# Patient Record
Sex: Female | Born: 1970 | Race: Black or African American | Hispanic: No | Marital: Single | Smoking: Never smoker
Health system: Southern US, Community
[De-identification: ages and names within clinical notes are randomized; demographics above are authoritative.]

## PROBLEM LIST (undated history)

## (undated) DIAGNOSIS — G4730 Sleep apnea, unspecified: Secondary | ICD-10-CM

## (undated) DIAGNOSIS — I10 Essential (primary) hypertension: Secondary | ICD-10-CM

---

## 1998-07-21 ENCOUNTER — Encounter: Admission: RE | Admit: 1998-07-21 | Discharge: 1998-07-21 | Payer: Self-pay | Admitting: Family Medicine

## 1998-08-21 ENCOUNTER — Encounter: Admission: RE | Admit: 1998-08-21 | Discharge: 1998-08-21 | Payer: Self-pay | Admitting: Family Medicine

## 1998-08-25 ENCOUNTER — Encounter: Admission: RE | Admit: 1998-08-25 | Discharge: 1998-08-25 | Payer: Self-pay | Admitting: Family Medicine

## 1999-07-31 ENCOUNTER — Encounter: Admission: RE | Admit: 1999-07-31 | Discharge: 1999-07-31 | Payer: Self-pay | Admitting: Family Medicine

## 1999-08-30 ENCOUNTER — Encounter: Payer: Self-pay | Admitting: *Deleted

## 1999-08-30 ENCOUNTER — Emergency Department (HOSPITAL_COMMUNITY): Admission: EM | Admit: 1999-08-30 | Discharge: 1999-08-30 | Payer: Self-pay | Admitting: *Deleted

## 1999-11-20 ENCOUNTER — Ambulatory Visit (HOSPITAL_COMMUNITY): Admission: RE | Admit: 1999-11-20 | Discharge: 1999-11-20 | Payer: Self-pay | Admitting: *Deleted

## 1999-11-20 ENCOUNTER — Encounter: Admission: RE | Admit: 1999-11-20 | Discharge: 1999-11-20 | Payer: Self-pay | Admitting: Family Medicine

## 1999-12-08 ENCOUNTER — Encounter: Admission: RE | Admit: 1999-12-08 | Discharge: 1999-12-08 | Payer: Self-pay | Admitting: Family Medicine

## 2000-03-22 ENCOUNTER — Encounter: Admission: RE | Admit: 2000-03-22 | Discharge: 2000-03-22 | Payer: Self-pay | Admitting: Sports Medicine

## 2000-04-28 ENCOUNTER — Emergency Department (HOSPITAL_COMMUNITY): Admission: EM | Admit: 2000-04-28 | Discharge: 2000-04-28 | Payer: Self-pay | Admitting: *Deleted

## 2000-10-17 ENCOUNTER — Encounter: Payer: Self-pay | Admitting: Family Medicine

## 2000-10-17 ENCOUNTER — Inpatient Hospital Stay (HOSPITAL_COMMUNITY): Admission: AD | Admit: 2000-10-17 | Discharge: 2000-10-17 | Payer: Self-pay | Admitting: Obstetrics

## 2000-10-17 ENCOUNTER — Encounter: Admission: RE | Admit: 2000-10-17 | Discharge: 2000-10-17 | Payer: Self-pay | Admitting: Family Medicine

## 2001-01-19 ENCOUNTER — Encounter: Admission: RE | Admit: 2001-01-19 | Discharge: 2001-01-19 | Payer: Self-pay | Admitting: Family Medicine

## 2001-04-17 ENCOUNTER — Other Ambulatory Visit: Admission: RE | Admit: 2001-04-17 | Discharge: 2001-04-17 | Payer: Self-pay | Admitting: *Deleted

## 2001-04-17 ENCOUNTER — Encounter: Admission: RE | Admit: 2001-04-17 | Discharge: 2001-04-17 | Payer: Self-pay | Admitting: Family Medicine

## 2001-10-16 ENCOUNTER — Encounter: Admission: RE | Admit: 2001-10-16 | Discharge: 2001-10-16 | Payer: Self-pay | Admitting: Family Medicine

## 2001-10-16 ENCOUNTER — Ambulatory Visit (HOSPITAL_COMMUNITY): Admission: RE | Admit: 2001-10-16 | Discharge: 2001-10-16 | Payer: Self-pay | Admitting: Family Medicine

## 2003-12-13 ENCOUNTER — Encounter: Admission: RE | Admit: 2003-12-13 | Discharge: 2003-12-13 | Payer: Self-pay | Admitting: Family Medicine

## 2004-01-30 ENCOUNTER — Encounter: Admission: RE | Admit: 2004-01-30 | Discharge: 2004-01-30 | Payer: Self-pay | Admitting: Family Medicine

## 2004-03-23 ENCOUNTER — Encounter: Admission: RE | Admit: 2004-03-23 | Discharge: 2004-03-23 | Payer: Self-pay | Admitting: Family Medicine

## 2004-06-02 ENCOUNTER — Encounter: Admission: RE | Admit: 2004-06-02 | Discharge: 2004-06-02 | Payer: Self-pay | Admitting: Family Medicine

## 2004-06-03 ENCOUNTER — Encounter: Admission: RE | Admit: 2004-06-03 | Discharge: 2004-06-03 | Payer: Self-pay | Admitting: Family Medicine

## 2004-12-28 ENCOUNTER — Ambulatory Visit: Payer: Self-pay | Admitting: Family Medicine

## 2004-12-30 ENCOUNTER — Encounter (INDEPENDENT_AMBULATORY_CARE_PROVIDER_SITE_OTHER): Payer: Self-pay | Admitting: *Deleted

## 2004-12-30 LAB — CONVERTED CEMR LAB

## 2005-01-29 ENCOUNTER — Ambulatory Visit: Payer: Self-pay | Admitting: Family Medicine

## 2005-03-23 ENCOUNTER — Ambulatory Visit: Payer: Self-pay | Admitting: Family Medicine

## 2006-12-29 DIAGNOSIS — K219 Gastro-esophageal reflux disease without esophagitis: Secondary | ICD-10-CM | POA: Insufficient documentation

## 2006-12-30 ENCOUNTER — Encounter (INDEPENDENT_AMBULATORY_CARE_PROVIDER_SITE_OTHER): Payer: Self-pay | Admitting: *Deleted

## 2007-04-05 ENCOUNTER — Telehealth: Payer: Self-pay | Admitting: *Deleted

## 2007-04-05 ENCOUNTER — Ambulatory Visit: Payer: Self-pay | Admitting: Family Medicine

## 2007-05-04 ENCOUNTER — Telehealth: Payer: Self-pay | Admitting: *Deleted

## 2007-12-07 ENCOUNTER — Ambulatory Visit: Payer: Self-pay | Admitting: Family Medicine

## 2007-12-07 ENCOUNTER — Encounter (INDEPENDENT_AMBULATORY_CARE_PROVIDER_SITE_OTHER): Payer: Self-pay | Admitting: *Deleted

## 2007-12-07 LAB — CONVERTED CEMR LAB: Beta hcg, urine, semiquantitative: POSITIVE

## 2007-12-08 LAB — CONVERTED CEMR LAB
Chlamydia, DNA Probe: NEGATIVE
GC Probe Amp, Genital: NEGATIVE

## 2007-12-14 ENCOUNTER — Encounter (INDEPENDENT_AMBULATORY_CARE_PROVIDER_SITE_OTHER): Payer: Self-pay | Admitting: *Deleted

## 2007-12-21 ENCOUNTER — Telehealth (INDEPENDENT_AMBULATORY_CARE_PROVIDER_SITE_OTHER): Payer: Self-pay | Admitting: *Deleted

## 2008-01-02 ENCOUNTER — Encounter: Payer: Self-pay | Admitting: Family Medicine

## 2008-01-02 ENCOUNTER — Ambulatory Visit: Payer: Self-pay | Admitting: Family Medicine

## 2009-01-18 ENCOUNTER — Ambulatory Visit: Payer: Self-pay | Admitting: Obstetrics and Gynecology

## 2009-01-18 ENCOUNTER — Inpatient Hospital Stay (HOSPITAL_COMMUNITY): Admission: AD | Admit: 2009-01-18 | Discharge: 2009-01-18 | Payer: Self-pay | Admitting: Obstetrics & Gynecology

## 2009-06-27 ENCOUNTER — Encounter: Payer: Self-pay | Admitting: Family Medicine

## 2009-06-27 ENCOUNTER — Ambulatory Visit: Payer: Self-pay | Admitting: Family Medicine

## 2009-06-27 LAB — CONVERTED CEMR LAB
Bilirubin Urine: NEGATIVE
Blood in Urine, dipstick: NEGATIVE
Chlamydia, DNA Probe: NEGATIVE
GC Probe Amp, Genital: NEGATIVE
Glucose, Urine, Semiquant: NEGATIVE
Ketones, urine, test strip: NEGATIVE
Nitrite: NEGATIVE
Specific Gravity, Urine: >=1.03
Urobilinogen, UA: 0.2
WBC Urine, dipstick: NEGATIVE
Whiff Test: POSITIVE
pH: 6

## 2009-11-17 ENCOUNTER — Ambulatory Visit (HOSPITAL_COMMUNITY): Admission: RE | Admit: 2009-11-17 | Discharge: 2009-11-17 | Payer: Self-pay | Admitting: Family Medicine

## 2009-11-17 ENCOUNTER — Ambulatory Visit: Payer: Self-pay | Admitting: Family Medicine

## 2009-11-17 ENCOUNTER — Encounter: Payer: Self-pay | Admitting: Family Medicine

## 2009-11-21 ENCOUNTER — Encounter: Payer: Self-pay | Admitting: Sports Medicine

## 2010-04-10 ENCOUNTER — Ambulatory Visit: Payer: Self-pay | Admitting: Family Medicine

## 2010-06-02 ENCOUNTER — Emergency Department (HOSPITAL_COMMUNITY): Admission: EM | Admit: 2010-06-02 | Discharge: 2010-06-03 | Payer: Self-pay | Admitting: Emergency Medicine

## 2010-09-07 ENCOUNTER — Encounter: Payer: Self-pay | Admitting: Sports Medicine

## 2010-10-13 ENCOUNTER — Emergency Department (HOSPITAL_COMMUNITY)
Admission: EM | Admit: 2010-10-13 | Discharge: 2010-10-14 | Payer: Self-pay | Source: Home / Self Care | Admitting: Emergency Medicine

## 2010-12-01 NOTE — Assessment & Plan Note (Signed)
Summary: chest pain off and on for 1 week      Allergies Added: NKDA    Vital Signs:  Patient profile:   40 year old female Height:      67.5 inches Weight:      231.4 pounds Temp:     98.4 degrees F Pulse rate:   93 / minute BP sitting:   117 / 80  (left arm)  Vitals Entered By: Theresia Lo RN (November 17, 2009 3:10 PM)  Review of Systems        vitals reviewed and pertinent negatives and positives seen in HPI    Primary Care Provider:  Rodney Langton MD  CC:  chest pain off and on for one week and seems to notice more after eating and lays down.  History of Present Illness: Pt has had chest pain come and go for a week. It started after a day at work and felt like a vice grip on her chest. She took an ASA 325mg  and felt better, but that night the chest pain returned. She again took an ASA and it improved. She has bee taking ASA 325 mg this week off and on as the pain returns. It does not seem to be exclusively associated with exercise or rest, it would last hours if she didn't take the pain meds. There is no fam hx of MI or any other heart disease. She has not been lifting or straining with her upper body. She notices the pain more when she eats and lies down. She has a h/o occaional reflux but says she does not have it enough to need a medications. She has not had any recent URI/infections. She has not been sitting abnormally long, she is not on contraception hormones.    Impression & Recommendations:  Problem # 1:  CHEST PAIN UNSPECIFIED (ICD-786.50) Assessment New Pt came in for a flu shot and mentioned she had chest pain. EKG showed NSR with no abnormalities. Considered pericarditis but no ST seg elevation, no risk factors for MI (no fam hx, no prior history) but will get FLP to risk stratisfy, no significant risk factors for PE (no SOB, prolonged sitting out of hte ordinary, leg swelling or erythema), no signs of chest muscle strain or costochondritis.  Pt will  remain on daily ASA until she comes to see her PCP next month. Discussed ETT with Dr. Leveda Anna but no indications for it at this time. Likely can stop daily ASA at the time of her next visit.   Orders: EKG- [facility] (EKG) [facility]- Est  Level 4 (99214)Future Orders: Lipid-[facility] ([id number]) ... 11/12/2010  Complete Medication List: 1)  Metronidazole 500 Mg Tabs (Metronidazole) .... One tab by mouth two times a day for 10 days.  Other Orders: Influenza Vaccine NON MCR (09811)    Physical Exam  General:  Well-developed,well-nourished,in no acute distress; alert,appropriate and cooperative throughout examination Neck:  No deformities, masses, or tenderness noted.No bruits Lungs:  Normal respiratory effort, chest expands symmetrically. Lungs are clear to auscultation, no crackles or wheezes. Heart:  Normal rate and regular rhythm. S1 and S2 normal without gallop, murmur, click, rub or other extra sounds.  CC: chest pain off and on for one week, seems to notice more after eating and lays down Is Patient Diabetic? No Pain Assessment Patient in pain? no        Habits & Providers  Alcohol-Tobacco-Diet     Tobacco Status: never  Current Medications (verified): 1)  Metronidazole 500 Mg  Tabs (Metronidazole) .... One Tab By Mouth Two Times A Day For 10 Days.  Allergies (verified): No Known Drug Allergies  Social History: works at [employer].  High school education. Lives with 2 children ages 73 as of 11/2007(Reshawnita), 40.  Siblings/ family  local. Denies cigs, drugs.  Occ E[employer]h. Intermittently sexually active   Patient Instructions: 1)  Take a baby 81 mg ASA daily. 2)  Make a Lab appointment to get your fasting lipid panal. 3)  Come to appointment on Feb 11th with PCP.    Immunizations Administered:  Influenza Vaccine # 1:    Vaccine Type: Fluvax Non-MCR    Site: left deltoid    Mfr: GlaxoSmithKline    Dose: 0.5 ml    Route: IM    Given by: Theresia Lo RN    Exp. Date:  04/30/2010    Lot #: [id number]    VIS given: 06/10/2009  Flu Vaccine Consent Questions:    Do you have a history of severe allergic reactions to this vaccine? no    Any prior history of allergic reactions to egg and/or gelatin? no    Do you have a sensitivity to the preservative Thimersol? no    Do you have a past history of Guillan-Barre Syndrome? no    Do you currently have an acute febrile illness? no    Have you ever had a severe reaction to latex? no    Vaccine information given and explained to patient? yes    Are you currently pregnant? no  Orders Added: 1)  EKG- [facility] [EKG] 2)  Lipid-[facility] [[id number]] 3)  Influenza Vaccine NON MCR [00028] 4)  [facility]- Est  Level 4 [[address]109]   Vital Signs:  Patient profile:   40 year old female Height:      67.5 inches Weight:      231.4 pounds Temp:     98.4 degrees F Pulse rate:   93 / minute BP sitting:   117 / 80  (left arm)  Vitals Entered By: Theresia Lo RN (November 17, 2009 3:10 PM)

## 2010-12-01 NOTE — Assessment & Plan Note (Signed)
Summary: foreign body,df   Vital Signs:  Patient profile:   40 year old female Height:      67.5 inches Weight:      247.4 pounds BMI:     38.31 Temp:     98.5 degrees F oral Pulse rate:   67 / minute BP sitting:   123 / 80  (left arm) Cuff size:   large  Vitals Entered By: Gladstone Pih (April 10, 2010 11:09 AM) CC: vaginal foreign body removal Is Patient Diabetic? No Pain Assessment Patient in pain? no        Primary Care Provider:  Rodney Langton MD  CC:  vaginal foreign body removal.  History of Present Illness: 40 yo here for work in appt  Had intercourse last night at which time condom fell off and never exited vaginal canal.  patietn attempted to remove condom herself but was unsuccessful.  Comes in today for removal of foreign body from vagina.  Has had no vaginal bleeding, vaginal discomfort, or vaginal discharge.  No monogamous relationship.  Desires contraception.  Habits & Providers  Alcohol-Tobacco-Diet     Tobacco Status: never  Current Medications (verified): 1)  Estrostep Fe 1-20/1-30/1-35 Mg-Mcg Tabs (Norethindron-Ethinyl Estrad-Fe) .... Take One Tablet At The Same Time Daily.  Dispense One Pack  Allergies: No Known Drug Allergies PMH-FH-SH reviewed-no changes except otherwise noted  Review of Systems      See HPI General:  Denies fever. GU:  Denies abnormal vaginal bleeding, discharge, dysuria, and genital sores.  Physical Exam  General:  Well-developed,well-nourished,in no acute distress; alert,appropriate and cooperative throughout examination Genitalia:  no vaginal discharge and no vaginal or cervical lesions.  Small excoriation at 6 o clock position of perineum at vaginal opening.  Intact condom retreived with ring forceps.   Impression & Recommendations:  Problem # 1:  FOREIGN BODY IN VULVA AND VAGINA (ICD-939.2)  Condom removed.  No signs of infection.  Discussed emergency contraception with patient and given patient education.   Patient agreeable to pickign it up and also will re-start contraception.    Orders: [facility]- Est Level  3 (16109)  Problem # 2:  CONTRACEPTIVE MANAGEMENT (ICD-V25.09)  restarted patient's hormonal contraception with her previous pill as requested.  Discussed with patient methods of birth control and she is considering BTL.  She will discuss further with her PCP.  Orders: [facility]- Est Level  3 (60454)  Complete Medication List: 1)  Estrostep Fe 1-20/1-30/1-35 Mg-mcg Tabs (Norethindron-ethinyl estrad-fe) .... Take one tablet at the same time daily.  dispense one pack  Patient Instructions: 1)  see instructions on morning after contraception 2)  Make appt with Dr. Karie Schwalbe for annual phsyica/gyn exam Prescriptions: ESTROSTEP FE 1-20/1-30/1-35 MG-MCG TABS (NORETHINDRON-ETHINYL ESTRAD-FE) take one tablet at the same time daily.  Dispense one pack  #1 x 11   Entered and Authorized by:   Delbert Harness MD   Signed by:   Delbert Harness MD on 04/10/2010   Method used:   Electronically to        [facility] [address] [id number]* (retail)       [address] [place], [place]  [address]       Ph: [phone]       Fax: [phone]   RxID:   [id number]

## 2010-12-01 NOTE — Miscellaneous (Signed)
   Clinical Lists Changes  Problems: Removed problem of CONTRACEPTIVE MANAGEMENT (ICD-V25.09) Removed problem of FOREIGN BODY IN VULVA AND VAGINA (ICD-939.2) Removed problem of CHEST PAIN UNSPECIFIED (ICD-786.50) Removed problem of HEADACHE (ICD-784.0) Removed problem of DYSURIA (ICD-788.1) Removed problem of VAGINAL DISCHARGE (ICD-623.5) Removed problem of UPPER RESPIRATORY INFECTION, ACUTE (ICD-465.9) Removed problem of HEALTHY ADULT FEMALE (ICD-V70.0) Removed problem of PREGNANT STATE, INCIDENTAL (ICD-V22.2) Removed problem of SCREENING FOR MALIGNANT NEOPLASM OF THE CERVIX (ICD-V76.2) Removed problem of AMENORRHEA (ICD-626.0) Removed problem of LOW BACK PAIN, ACUTE (ICD-724.2) Removed problem of GANGLION, UNSPECIFIED (ICD-727.43)

## 2010-12-30 ENCOUNTER — Encounter: Payer: Self-pay | Admitting: *Deleted

## 2011-01-11 LAB — POCT I-STAT, CHEM 8
Creatinine, Ser: 0.7 mg/dL (ref 0.4–1.2)
Hemoglobin: 13.3 g/dL (ref 12.0–15.0)
Sodium: 139 mEq/L (ref 135–145)
TCO2: 21 mmol/L (ref 0–100)

## 2011-02-11 LAB — ABO/RH: ABO/RH(D): O POS

## 2011-02-17 ENCOUNTER — Emergency Department (HOSPITAL_COMMUNITY): Payer: BC Managed Care – PPO

## 2011-02-17 ENCOUNTER — Emergency Department (HOSPITAL_COMMUNITY)
Admission: EM | Admit: 2011-02-17 | Discharge: 2011-02-17 | Disposition: A | Discharge status: Home / Self Care | Payer: BC Managed Care – PPO | Attending: Emergency Medicine | Admitting: Emergency Medicine

## 2011-02-17 DIAGNOSIS — Z049 Encounter for examination and observation for unspecified reason: Secondary | ICD-10-CM | POA: Insufficient documentation

## 2011-02-17 DIAGNOSIS — R221 Localized swelling, mass and lump, neck: Secondary | ICD-10-CM | POA: Insufficient documentation

## 2011-02-17 DIAGNOSIS — R220 Localized swelling, mass and lump, head: Secondary | ICD-10-CM | POA: Insufficient documentation

## 2011-05-18 ENCOUNTER — Inpatient Hospital Stay (INDEPENDENT_AMBULATORY_CARE_PROVIDER_SITE_OTHER)
Admission: RE | Admit: 2011-05-18 | Discharge: 2011-05-18 | Disposition: A | Discharge status: Home / Self Care | Payer: BC Managed Care – PPO | Source: Ambulatory Visit | Attending: Family Medicine | Admitting: Family Medicine

## 2011-11-29 ENCOUNTER — Encounter (HOSPITAL_COMMUNITY): Payer: Self-pay | Admitting: Emergency Medicine

## 2011-11-29 ENCOUNTER — Other Ambulatory Visit: Payer: Self-pay

## 2011-11-29 ENCOUNTER — Emergency Department (HOSPITAL_COMMUNITY)
Admission: EM | Admit: 2011-11-29 | Discharge: 2011-11-30 | Disposition: A | Discharge status: Home / Self Care | Payer: BC Managed Care – PPO | Attending: Emergency Medicine | Admitting: Emergency Medicine

## 2011-11-29 DIAGNOSIS — R002 Palpitations: Secondary | ICD-10-CM | POA: Insufficient documentation

## 2011-11-29 NOTE — ED Notes (Signed)
Pt states she felt nauseated yesterday and she had a headache  Pt states she took 2 advil migraines  Pt states today she has been nauseated as well  She went to work and states her blood pressure was elevated  Tonight when she laid down she felt as if her heart was racing so she came in for evaluation  Pt states she ate a chicken tender and an apple and has been drinking water  Pt states she just does not feel well

## 2011-11-30 LAB — BASIC METABOLIC PANEL
CO2: 24 mEq/L (ref 19–32)
Calcium: 8.8 mg/dL (ref 8.4–10.5)
Creatinine, Ser: 0.54 mg/dL (ref 0.50–1.10)
GFR calc Af Amer: >90 mL/min (ref >90)

## 2011-11-30 LAB — TSH: TSH: 2.366 u[IU]/mL (ref 0.350–4.500)

## 2011-11-30 MED ORDER — POTASSIUM CHLORIDE CRYS ER 20 MEQ PO TBCR
40.0000 meq | EXTENDED_RELEASE_TABLET | Freq: Once | ORAL | Status: AC
  Administered 2011-11-30: 40 meq via ORAL
  Filled 2011-11-30: qty 2

## 2011-11-30 NOTE — ED Provider Notes (Signed)
History     CSN: [id number]  Arrival date & time 11/29/11  2249   First MD Initiated Contact with Patient 11/30/11 0034      Chief Complaint  Patient presents with  . Palpitations    (Consider location/radiation/quality/duration/timing/severity/associated sxs/prior treatment) HPI This is a 41 year old black female who has had 3 episodes of palpitations. The first episode occurred 2 nights ago while she was lying down. The second episode occurred she was lying down earlier this evening. She had a third episode when she first got to the ED. These each episode lasts about 5 seconds and is characterized as a rapid heart rate that causes her whole body to shake. There was no associated chest pain, dyspnea or diaphoresis. She did have a headache yesterday that resolved after taking 2 Advil migraine tablets. This was followed by some nausea this subsequently resolved.  History reviewed. No pertinent past medical history.  History reviewed. No pertinent past surgical history.  Family History  Problem Relation Age of Onset  . Hypertension Father   . Hypertension Other     History  Substance Use Topics  . Smoking status: Never Smoker   . Smokeless tobacco: Not on file  . Alcohol Use: No    OB History    Grav Para Term Preterm Abortions TAB SAB Ect Mult Living                  Review of Systems  All other systems reviewed and are negative.    Allergies  Review of patient's allergies indicates no known allergies.  Home Medications   Current Outpatient Rx  Name Route Sig Dispense Refill  . IBUPROFEN 200 MG PO TABS Oral Take 400 mg by mouth every 6 (six) hours as needed. pain    . OMEGA-3-ACID ETHYL ESTERS 1 G PO CAPS Oral Take 2 g by mouth daily.    Vladimir Creeks ESTRAD-FE 1-20/1-30/1-35 MG-MCG PO TABS Oral Take by mouth daily. Take at same time daily       BP 136/87  Pulse 82  Temp(Src) 98.6 F (37 C) (Oral)  Resp 20  SpO2 100%  LMP 10/24/2011  Physical  Exam General: Well-developed, well-nourished female in no acute distress; appearance consistent with age of record HENT: normocephalic, atraumatic Eyes: pupils equal round and reactive to light; extraocular muscles intact Neck: supple Heart: regular rate and rhythm; no murmurs, rubs or gallops; no ectopy Lungs: clear to auscultation bilaterally Abdomen: soft; nondistended; nontender Extremities: No deformity; full range of motion; pulses normal Neurologic: Awake, alert and oriented; motor function intact in all extremities and symmetric; no facial droop Skin: Warm and dry Psychiatric: Normal mood and affect    ED Course  Procedures (including critical care time)     MDM  EKG Interpretation:  Date & Time: 11/30/2011 11:05 PM  Rate: 73  Rhythm: normal sinus rhythm  QRS Axis: normal  Intervals: normal  ST/T Wave abnormalities: normal  Conduction Disutrbances:none  Narrative Interpretation:   Old EKG Reviewed: unchanged  12:45 AM Patient's heart monitor history reviewed in no ectopy or arrhythmias seen.  Nursing notes and vitals signs, including pulse oximetry, reviewed.  Summary of this visit's results, reviewed by myself:  Labs:  Results for orders placed during the hospital encounter of 11/29/11  BASIC METABOLIC PANEL      Component Value Range   Sodium 134 (*) 135 - 145 (mEq/L)   Potassium 3.2 (*) 3.5 - 5.1 (mEq/L)   Chloride 100  96 - 112 (  mEq/L)   CO2 24  19 - 32 (mEq/L)   Glucose, Bld 92  70 - 99 (mg/dL)   BUN 8  6 - 23 (mg/dL)   Creatinine, Ser 1.61  0.50 - 1.10 (mg/dL)   Calcium 8.8  8.4 - 09.6 (mg/dL)   GFR calc non Af Amer >90  >90 (mL/min)   GFR calc Af Amer >90  >90 (mL/min)               Hanley Seamen, MD 11/30/11 [phone]

## 2012-05-09 ENCOUNTER — Inpatient Hospital Stay (HOSPITAL_COMMUNITY): Payer: BC Managed Care – PPO

## 2012-05-09 ENCOUNTER — Encounter (HOSPITAL_COMMUNITY): Payer: Self-pay | Admitting: *Deleted

## 2012-05-09 ENCOUNTER — Inpatient Hospital Stay (HOSPITAL_COMMUNITY)
Admission: AD | Admit: 2012-05-09 | Discharge: 2012-05-09 | Disposition: A | Discharge status: Home / Self Care | Payer: BC Managed Care – PPO | Source: Ambulatory Visit | Attending: Obstetrics & Gynecology | Admitting: Obstetrics & Gynecology

## 2012-05-09 DIAGNOSIS — N949 Unspecified condition associated with female genital organs and menstrual cycle: Secondary | ICD-10-CM | POA: Insufficient documentation

## 2012-05-09 DIAGNOSIS — B9689 Other specified bacterial agents as the cause of diseases classified elsewhere: Secondary | ICD-10-CM

## 2012-05-09 DIAGNOSIS — Z331 Pregnant state, incidental: Secondary | ICD-10-CM

## 2012-05-09 DIAGNOSIS — N760 Acute vaginitis: Secondary | ICD-10-CM | POA: Insufficient documentation

## 2012-05-09 DIAGNOSIS — O2390 Unspecified genitourinary tract infection in pregnancy, unspecified trimester: Secondary | ICD-10-CM | POA: Insufficient documentation

## 2012-05-09 DIAGNOSIS — A499 Bacterial infection, unspecified: Secondary | ICD-10-CM | POA: Insufficient documentation

## 2012-05-09 DIAGNOSIS — R1084 Generalized abdominal pain: Secondary | ICD-10-CM

## 2012-05-09 HISTORY — DX: Essential (primary) hypertension: I10

## 2012-05-09 HISTORY — DX: Sleep apnea, unspecified: G47.30

## 2012-05-09 LAB — URINALYSIS, ROUTINE W REFLEX MICROSCOPIC
Bilirubin Urine: NEGATIVE
Ketones, ur: NEGATIVE mg/dL
Nitrite: NEGATIVE
pH: 6 (ref 5.0–8.0)

## 2012-05-09 LAB — POCT PREGNANCY, URINE: Preg Test, Ur: POSITIVE — AB

## 2012-05-09 LAB — WET PREP, GENITAL

## 2012-05-09 MED ORDER — METRONIDAZOLE 500 MG PO TABS: 500.0000 mg | ORAL_TABLET | Freq: Two times a day (BID) | ORAL | Status: AC

## 2012-05-09 NOTE — MAU Note (Signed)
Pt complains of not having a period for the last two months and has started noticing a foul odor from vagina. Has been stressed and feels like that may be the cause of her missed period.

## 2012-05-09 NOTE — MAU Note (Signed)
Bedside ultrasound by Jeani Sow NP, ultrasound ordered. Pt very upset and crying.

## 2012-05-09 NOTE — MAU Provider Note (Signed)
Pt should stop daily aspirin.

## 2012-05-09 NOTE — MAU Provider Note (Signed)
History     CSN: [id number]  Arrival date and time: 05/09/12 4098   First Provider Initiated Contact with Patient 05/09/12 [phone]      Chief Complaint  Patient presents with  . Possible Pregnancy  . Vaginal Discharge   HPI Ashley Adams is 41 y.o. G2P2002 6 weeks presenting with a vaginal odor and a missed period.  Increased stress in the last few months.  Sees Dr. Ethelene Browns for hypertension and a recent sleep study.  Pt's meds were discontinued a few weeks ago because her BP readings were wnl.  Vaginal odor began in June. Had right sided pain over the last few days without vaginal bleeding.  No pain today.   Had pap in May--wnl.  LMP 03/26/12.  Using condoms everytime.  1 sexual partner.  Upset with report of positive pregnancy test.     Past Medical History  Diagnosis Date  . Hypertension   . Sleep apnea     History reviewed. No pertinent past surgical history.  Family History  Problem Relation Age of Onset  . Hypertension Father   . Hypertension Other     History  Substance Use Topics  . Smoking status: Never Smoker   . Smokeless tobacco: Not on file  . Alcohol Use: No    Allergies: No Known Allergies  Prescriptions prior to admission  Medication Sig Dispense Refill  . aspirin 81 MG chewable tablet Chew 81 mg by mouth daily.      . Fiber, Guar Gum, CHEW Chew 2 tablets by mouth every morning.      Marland Kitchen omega-3 acid ethyl esters (LOVAZA) 1 G capsule Take 1 g by mouth daily.       . Vitamin D, Ergocalciferol, (DRISDOL) 50000 UNITS CAPS Take 50,000 Units by mouth 2 (two) times a week. Tuesday & thursday        Review of Systems  Gastrointestinal: Positive for nausea. Negative for vomiting and abdominal pain.  Genitourinary:       + vaginal odor   Physical Exam   Blood pressure 135/85, pulse 90, temperature 97.8 F (36.6 C), temperature source Oral, resp. rate 18, last menstrual period 03/28/2012.  Physical Exam  Constitutional: She is oriented to person, place, and  time. She appears well-developed and well-nourished. No distress.  HENT:  Head: Normocephalic.  Neck: Normal range of motion.  Cardiovascular: Normal rate.   Respiratory: Effort normal.  GI: Soft. She exhibits no distension and no mass. There is no tenderness. There is no rebound and no guarding.  Genitourinary: Uterus is enlarged (difficult to evaluate size due to body habitus). Uterus is not tender. Cervix exhibits no discharge. Right adnexum displays no tenderness. Left adnexum displays no tenderness. No bleeding around the vagina. Vaginal discharge (moderate creamy discharge without odor) found.  Neurological: She is alert and oriented to person, place, and time.  Skin: Skin is warm and dry.  Psychiatric: She has a normal mood and affect. Her behavior is normal.    MAU Course  Procedures  Gc/CHl culture to lab  MDM Bedside u/s shows an active fetus--size larger than expected by LMP-formal ultrasound ordered for abdominal pain over the last few days, none today.  Patient is very upset about this pregnancy.   Assessment and Plan  A:Intrauterine pregnancy at [redacted] weeks gestation    Bacterial vaginosis  P:  Rx for Flagyl 500mg  bid X 1 week      Patient states she is unsure of plans for this pregnancy  Will see Dr. Gaynell Face if needed.    P:                KEY,EVE M 05/09/2012, 8:10 AM

## 2012-05-10 LAB — GC/CHLAMYDIA PROBE AMP, GENITAL
Chlamydia, DNA Probe: NEGATIVE
GC Probe Amp, Genital: NEGATIVE

## 2012-12-10 ENCOUNTER — Encounter (HOSPITAL_COMMUNITY): Payer: Self-pay | Admitting: *Deleted

## 2012-12-10 ENCOUNTER — Emergency Department (HOSPITAL_COMMUNITY)
Admission: EM | Admit: 2012-12-10 | Discharge: 2012-12-11 | Disposition: A | Discharge status: Home / Self Care | Payer: BC Managed Care – PPO | Attending: Emergency Medicine | Admitting: Emergency Medicine

## 2012-12-10 DIAGNOSIS — I10 Essential (primary) hypertension: Secondary | ICD-10-CM | POA: Insufficient documentation

## 2012-12-10 DIAGNOSIS — Z7982 Long term (current) use of aspirin: Secondary | ICD-10-CM | POA: Insufficient documentation

## 2012-12-10 DIAGNOSIS — R51 Headache: Secondary | ICD-10-CM | POA: Insufficient documentation

## 2012-12-10 MED ORDER — KETOROLAC TROMETHAMINE 30 MG/ML IJ SOLN
15.0000 mg | Freq: Once | INTRAMUSCULAR | Status: AC
  Administered 2012-12-10: via INTRAVENOUS
  Filled 2012-12-10: qty 1

## 2012-12-10 MED ORDER — DIPHENHYDRAMINE HCL 50 MG/ML IJ SOLN
25.0000 mg | Freq: Once | INTRAMUSCULAR | Status: AC
  Administered 2012-12-10: via INTRAVENOUS
  Filled 2012-12-10: qty 1

## 2012-12-10 MED ORDER — DEXAMETHASONE SODIUM PHOSPHATE 10 MG/ML IJ SOLN
10.0000 mg | Freq: Once | INTRAMUSCULAR | Status: AC
  Administered 2012-12-10: 10 mg via INTRAVENOUS
  Filled 2012-12-10: qty 1

## 2012-12-10 MED ORDER — METOCLOPRAMIDE HCL 5 MG/ML IJ SOLN
10.0000 mg | Freq: Once | INTRAMUSCULAR | Status: AC
Start: 2012-12-10 — End: 2012-12-10
  Administered 2012-12-10: 10 mg via INTRAVENOUS
  Filled 2012-12-10: qty 2

## 2012-12-10 MED ORDER — SODIUM CHLORIDE 0.9 % IV BOLUS (SEPSIS)
1000.0000 mL | Freq: Once | INTRAVENOUS | Status: AC
  Administered 2012-12-10: 1000 mL via INTRAVENOUS

## 2012-12-10 NOTE — ED Notes (Signed)
Pt c/o headache since Friday; increased pressure when leaning forward; nausea

## 2012-12-11 NOTE — ED Provider Notes (Signed)
History    42 year old female with headache. Gradual onset on Friday. No trauma. Pain is in the right temporal region and right face. Initially intermittent, but has been more or less constant for the past day. Associated with nausea and photophobia. No vomiting. No change in visual acuity. No numbness, talar loss of strength. No significant headache history. No blood thinning medications aside from 81 mg of aspirin daily. No past history of head or neck surgery. No sick contacts. Has been taking Tylenol w/ only Mild relief. No fevers or chills. No neck pain or neck stiffness.    CSN: [id number]  Arrival date & time 12/10/12  2217   First MD Initiated Contact with Patient 12/10/12 2300      No chief complaint on file.   (Consider location/radiation/quality/duration/timing/severity/associated sxs/prior treatment) HPI  Past Medical History  Diagnosis Date  . Hypertension   . Sleep apnea     History reviewed. No pertinent past surgical history.  Family History  Problem Relation Age of Onset  . Hypertension Father   . Hypertension Other     History  Substance Use Topics  . Smoking status: Never Smoker   . Smokeless tobacco: Not on file  . Alcohol Use: No    OB History   Grav Para Term Preterm Abortions TAB SAB Ect Mult Living   2 2 2  0 0 0 0 0 0 2      Review of Systems  All systems reviewed and negative, other than as noted in HPI.   Allergies  Review of patient's allergies indicates no known allergies.  Home Medications   Current Outpatient Rx  Name  Route  Sig  Dispense  Refill  . acetaminophen (TYLENOL) 500 MG tablet   Oral   Take 1,000 mg by mouth every 6 (six) hours as needed for pain. Migraines         . aspirin 81 MG chewable tablet   Oral   Chew 81 mg by mouth daily.         . vitamin B-12 (CYANOCOBALAMIN) 1000 MCG tablet   Oral   Take 1,000 mcg by mouth daily.           BP 130/85  Pulse 73  Temp(Src) 98 F (36.7 C) (Oral)  Resp 16   SpO2 99%  LMP 12/07/2012  Physical Exam  Nursing note and vitals reviewed. Constitutional: She is oriented to person, place, and time. She appears well-developed and well-nourished. No distress.  HENT:  Head: Normocephalic and atraumatic.  Eyes: Conjunctivae are normal. Pupils are equal, round, and reactive to light. Right eye exhibits no discharge. Left eye exhibits no discharge.  Optic discs sharp  Neck: Neck supple.  No nuchal rigidity  Cardiovascular: Normal rate, regular rhythm and normal heart sounds.  Exam reveals no gallop and no friction rub.   No murmur heard. Pulmonary/Chest: Effort normal and breath sounds normal. No respiratory distress.  Abdominal: Soft. She exhibits no distension. There is no tenderness.  Musculoskeletal: She exhibits no edema and no tenderness.  Neurological: She is alert and oriented to person, place, and time. No cranial nerve deficit. She exhibits normal muscle tone. Coordination normal.  Good finger to nose testing bilaterally  Skin: Skin is warm and dry.  Psychiatric: She has a normal mood and affect. Her behavior is normal. Thought content normal.    ED Course  Procedures (including critical care time)  Labs Reviewed - No data to display No results found.   1. Headache  MDM  42yF with headache. Suspect primary HA. Consider emergent secondary causes such as bleed, infectious or mass but doubt. There is no history of trauma. Pt has a nonfocal neurological exam. Afebrile and neck supple. No use of blood thinning medication. Consider ocular etiology such as acute angle closure glaucoma but doubt. Pt denies acute change in visual acuity and eye exam unremarkable. Doubt temporal arteritis given age, no temporal tenderness and temporal artery pulsations palpable. Doubt CO poisoning. No contacts with similar symptoms. Doubt venous thrombosis. Doubt carotid or vertebral arteries dissection. Symptoms improved with meds. Feel that can be safely  discharged, but strict return precautions discussed. Outpt fu.         Raeford Razor, MD 12/11/12 [phone]

## 2013-06-10 ENCOUNTER — Emergency Department (HOSPITAL_COMMUNITY)
Admission: EM | Admit: 2013-06-10 | Discharge: 2013-06-10 | Disposition: A | Discharge status: Home / Self Care | Payer: BC Managed Care – PPO | Attending: Emergency Medicine | Admitting: Emergency Medicine

## 2013-06-10 ENCOUNTER — Encounter (HOSPITAL_COMMUNITY): Payer: Self-pay | Admitting: *Deleted

## 2013-06-10 DIAGNOSIS — K089 Disorder of teeth and supporting structures, unspecified: Secondary | ICD-10-CM | POA: Insufficient documentation

## 2013-06-10 DIAGNOSIS — Z7982 Long term (current) use of aspirin: Secondary | ICD-10-CM | POA: Insufficient documentation

## 2013-06-10 DIAGNOSIS — R220 Localized swelling, mass and lump, head: Secondary | ICD-10-CM | POA: Insufficient documentation

## 2013-06-10 DIAGNOSIS — I10 Essential (primary) hypertension: Secondary | ICD-10-CM | POA: Insufficient documentation

## 2013-06-10 DIAGNOSIS — Z79899 Other long term (current) drug therapy: Secondary | ICD-10-CM | POA: Insufficient documentation

## 2013-06-10 DIAGNOSIS — K0889 Other specified disorders of teeth and supporting structures: Secondary | ICD-10-CM

## 2013-06-10 DIAGNOSIS — Z792 Long term (current) use of antibiotics: Secondary | ICD-10-CM | POA: Insufficient documentation

## 2013-06-10 MED ORDER — HYDROCODONE-ACETAMINOPHEN 5-325 MG PO TABS: 1.0000 | ORAL_TABLET | ORAL | Status: DC | PRN

## 2013-06-10 MED ORDER — PENICILLIN V POTASSIUM 250 MG PO TABS: 250.0000 mg | ORAL_TABLET | Freq: Four times a day (QID) | ORAL | Status: DC

## 2013-06-10 MED ORDER — HYDROCODONE-ACETAMINOPHEN 5-325 MG PO TABS
1.0000 | ORAL_TABLET | Freq: Once | ORAL | Status: AC
  Administered 2013-06-10: 1 via ORAL
  Filled 2013-06-10: qty 1

## 2013-06-10 NOTE — ED Provider Notes (Signed)
Medical screening examination/treatment/procedure(s) were performed by non-physician practitioner and as supervising physician I was immediately available for consultation/collaboration.  Shameek Nyquist M Rita Prom, MD 06/10/13 1217 

## 2013-06-10 NOTE — ED Provider Notes (Signed)
  CSN: [id number]     Arrival date & time 06/10/13  1011 History     First MD Initiated Contact with Patient 06/10/13 1016     Chief Complaint  Patient presents with  . Dental Pain   (Consider location/radiation/quality/duration/timing/severity/associated sxs/prior Treatment) HPI Comments: Pt states that the tooth cracked a week ago and the pain has been increasing but she woke up this morning and her right face was swollen  Patient is a 42 y.o. female presenting with tooth pain. No language interpreter was used.  Dental Pain Location:  Upper Quality:  Aching Severity:  Severe Onset quality:  Gradual Timing:  Constant Progression:  Worsening Context: dental fracture   Worsened by:  Cold food/drink Associated symptoms: facial swelling   Associated symptoms: no fever     Past Medical History  Diagnosis Date  . Hypertension   . Sleep apnea    History reviewed. No pertinent past surgical history. Family History  Problem Relation Age of Onset  . Hypertension Father   . Hypertension Other    History  Substance Use Topics  . Smoking status: Never Smoker   . Smokeless tobacco: Not on file  . Alcohol Use: No   OB History   Grav Para Term Preterm Abortions TAB SAB Ect Mult Living   2 2 2  0 0 0 0 0 0 2     Review of Systems  Constitutional: Negative for fever.  HENT: Positive for facial swelling.   Respiratory: Negative.   Cardiovascular: Negative.     Allergies  Review of patient's allergies indicates no known allergies.  Home Medications   Current Outpatient Rx  Name  Route  Sig  Dispense  Refill  . acetaminophen (TYLENOL) 500 MG tablet   Oral   Take 1,000 mg by mouth every 6 (six) hours as needed for pain. Migraines         . aspirin 81 MG chewable tablet   Oral   Chew 81 mg by mouth daily.         Marland Kitchen HYDROcodone-acetaminophen (NORCO/VICODIN) 5-325 MG per tablet   Oral   Take 1 tablet by mouth every 4 (four) hours as needed for pain.   10 tablet    0   . penicillin v potassium (VEETID) 250 MG tablet   Oral   Take 1 tablet (250 mg total) by mouth 4 (four) times daily.   40 tablet   0   . vitamin B-12 (CYANOCOBALAMIN) 1000 MCG tablet   Oral   Take 1,000 mcg by mouth daily.          BP 151/86  Pulse 75  Temp(Src) 99 F (37.2 C) (Oral)  Resp 16  SpO2 98% Physical Exam  Nursing note and vitals reviewed. Constitutional: She appears well-developed and well-nourished.  HENT:  Right Ear: External ear normal.  Left Ear: External ear normal.  Mouth/Throat:    Decay noted:swelling noted to the right cheek  Eyes: Conjunctivae and EOM are normal.  Cardiovascular: Normal rate and regular rhythm.   Pulmonary/Chest: Effort normal and breath sounds normal.    ED Course   Procedures (including critical care time)  Labs Reviewed - No data to display No results found. 1. Toothache     MDM  Will treat with antibiotics and something for pain:pt given dental referral although she states that she has one  Teressa Lower, NP 06/10/13 1029

## 2013-06-10 NOTE — ED Notes (Signed)
Pt reports part of a filling on upper right jaw fell off last week. Reports upper jaw pain started yesterday 10/10, she tried calling dentist, but dentist was closed on Friday. Reports woke up today with swelling to right side of mouth.

## 2013-06-10 NOTE — ED Notes (Signed)
Pt escorted to discharge window. Pt verbalized understanding discharge instructions. In no acute distress.  

## 2013-06-11 ENCOUNTER — Emergency Department (HOSPITAL_COMMUNITY)
Admission: EM | Admit: 2013-06-11 | Discharge: 2013-06-11 | Disposition: A | Discharge status: Home / Self Care | Payer: BC Managed Care – PPO | Attending: Emergency Medicine | Admitting: Emergency Medicine

## 2013-06-11 ENCOUNTER — Encounter (HOSPITAL_COMMUNITY): Payer: Self-pay

## 2013-06-11 ENCOUNTER — Emergency Department (HOSPITAL_COMMUNITY): Payer: BC Managed Care – PPO

## 2013-06-11 DIAGNOSIS — K0889 Other specified disorders of teeth and supporting structures: Secondary | ICD-10-CM

## 2013-06-11 DIAGNOSIS — Z7982 Long term (current) use of aspirin: Secondary | ICD-10-CM | POA: Insufficient documentation

## 2013-06-11 DIAGNOSIS — K047 Periapical abscess without sinus: Secondary | ICD-10-CM

## 2013-06-11 DIAGNOSIS — M2550 Pain in unspecified joint: Secondary | ICD-10-CM | POA: Insufficient documentation

## 2013-06-11 DIAGNOSIS — Z792 Long term (current) use of antibiotics: Secondary | ICD-10-CM | POA: Insufficient documentation

## 2013-06-11 DIAGNOSIS — R1310 Dysphagia, unspecified: Secondary | ICD-10-CM | POA: Insufficient documentation

## 2013-06-11 DIAGNOSIS — F411 Generalized anxiety disorder: Secondary | ICD-10-CM | POA: Insufficient documentation

## 2013-06-11 DIAGNOSIS — K0381 Cracked tooth: Secondary | ICD-10-CM | POA: Insufficient documentation

## 2013-06-11 DIAGNOSIS — K089 Disorder of teeth and supporting structures, unspecified: Secondary | ICD-10-CM | POA: Insufficient documentation

## 2013-06-11 DIAGNOSIS — M542 Cervicalgia: Secondary | ICD-10-CM | POA: Insufficient documentation

## 2013-06-11 DIAGNOSIS — K029 Dental caries, unspecified: Secondary | ICD-10-CM

## 2013-06-11 DIAGNOSIS — IMO0001 Reserved for inherently not codable concepts without codable children: Secondary | ICD-10-CM | POA: Insufficient documentation

## 2013-06-11 DIAGNOSIS — I10 Essential (primary) hypertension: Secondary | ICD-10-CM | POA: Insufficient documentation

## 2013-06-11 DIAGNOSIS — R509 Fever, unspecified: Secondary | ICD-10-CM | POA: Insufficient documentation

## 2013-06-11 LAB — POCT I-STAT, CHEM 8
Creatinine, Ser: 0.6 mg/dL (ref 0.50–1.10)
Glucose, Bld: 97 mg/dL (ref 70–99)
HCT: 41 % (ref 36.0–46.0)
Hemoglobin: 13.9 g/dL (ref 12.0–15.0)
Potassium: 4.4 mEq/L (ref 3.5–5.1)
Sodium: 138 mEq/L (ref 135–145)
TCO2: 24 mmol/L (ref 0–100)

## 2013-06-11 MED ORDER — CLINDAMYCIN PHOSPHATE 600 MG/50ML IV SOLN
600.0000 mg | Freq: Once | INTRAVENOUS | Status: AC
  Administered 2013-06-11: 600 mg via INTRAVENOUS
  Filled 2013-06-11: qty 50

## 2013-06-11 MED ORDER — OXYCODONE-ACETAMINOPHEN 10-325 MG PO TABS: 1.0000 | ORAL_TABLET | ORAL | Status: DC | PRN

## 2013-06-11 MED ORDER — KETOROLAC TROMETHAMINE 30 MG/ML IJ SOLN
30.0000 mg | Freq: Once | INTRAMUSCULAR | Status: AC
  Administered 2013-06-11: 30 mg via INTRAVENOUS
  Filled 2013-06-11: qty 1

## 2013-06-11 MED ORDER — CLINDAMYCIN HCL 150 MG PO CAPS: 300.0000 mg | ORAL_CAPSULE | Freq: Four times a day (QID) | ORAL | Status: DC

## 2013-06-11 MED ORDER — SODIUM CHLORIDE 0.9 % IV BOLUS (SEPSIS)
1000.0000 mL | Freq: Once | INTRAVENOUS | Status: AC
  Administered 2013-06-11: 1000 mL via INTRAVENOUS

## 2013-06-11 MED ORDER — HYDROMORPHONE HCL PF 1 MG/ML IJ SOLN
1.0000 mg | Freq: Once | INTRAMUSCULAR | Status: AC
  Administered 2013-06-11: 1 mg via INTRAVENOUS
  Filled 2013-06-11: qty 1

## 2013-06-11 MED ORDER — IOHEXOL 300 MG/ML  SOLN
100.0000 mL | Freq: Once | INTRAMUSCULAR | Status: AC | PRN
  Administered 2013-06-11: 100 mL via INTRAVENOUS

## 2013-06-11 NOTE — ED Notes (Signed)
Pt states tx here yesterday for same, now having facial swelling; c/o rt upper broken teeth x3days

## 2013-06-11 NOTE — ED Provider Notes (Signed)
CSN: [id number]     Arrival date & time 06/11/13  1030 History     First MD Initiated Contact with Patient 06/11/13 1047     Chief Complaint  Patient presents with  . Facial Swelling    broken tooth   (Consider location/radiation/quality/duration/timing/severity/associated sxs/prior Treatment) HPI Ashley Adams is a(n) 42 y.o. female who presents to the emergency department with chief complaint of right sided dental pain, facial swelling and neck pain.  She was seen in fast track here yesterday.  She was treated with pain medication and discharged with home pain meds and penicillin. The patient states that she has taken 5 doses of oral penicillin and that her pain and swelling have become worse.  She is now running a fever.  She complains of associated myalgias and chills.  She complains of right-sided facial pain, trismus, pain with swallowing.  She denies nausea, vomiting, diaphoresis, difficulty breathing.     Past Medical History  Diagnosis Date  . Hypertension   . Sleep apnea    History reviewed. No pertinent past surgical history. Family History  Problem Relation Age of Onset  . Hypertension Father   . Hypertension Other    History  Substance Use Topics  . Smoking status: Never Smoker   . Smokeless tobacco: Not on file  . Alcohol Use: No   OB History   Grav Para Term Preterm Abortions TAB SAB Ect Mult Living   2 2 2  0 0 0 0 0 0 2     Review of Systems  Constitutional: Positive for fever and chills.  HENT: Positive for facial swelling, trouble swallowing, neck pain and dental problem. Negative for voice change.   Eyes: Negative for visual disturbance.  Respiratory: Negative for shortness of breath and wheezing.   Cardiovascular: Negative for chest pain.  Gastrointestinal: Negative for nausea, vomiting and abdominal pain.  Genitourinary: Negative for dysuria and hematuria.  Musculoskeletal: Positive for myalgias and arthralgias.  Skin: Negative for color change and  wound.  Neurological: Negative for dizziness and light-headedness.  Psychiatric/Behavioral: The patient is nervous/anxious.     Allergies  Review of patient's allergies indicates no known allergies.  Home Medications   Current Outpatient Rx  Name  Route  Sig  Dispense  Refill  . aspirin 81 MG chewable tablet   Oral   Chew 81 mg by mouth daily.         Marland Kitchen HYDROcodone-acetaminophen (NORCO/VICODIN) 5-325 MG per tablet   Oral   Take 1 tablet by mouth every 4 (four) hours as needed for pain.   10 tablet   0   . ibuprofen (ADVIL,MOTRIN) 200 MG tablet   Oral   Take 800 mg by mouth every 8 (eight) hours as needed for pain.         Marland Kitchen penicillin v potassium (VEETID) 250 MG tablet   Oral   Take 1 tablet (250 mg total) by mouth 4 (four) times daily.   40 tablet   0    BP 112/73  Pulse 78  Temp(Src) 100.2 F (37.9 C) (Oral)  Resp 18  SpO2 97%  LMP 06/01/2013 Physical Exam  Nursing note and vitals reviewed. Constitutional: She is oriented to person, place, and time. She appears well-developed and well-nourished. No distress.  HENT:  Head: Normocephalic and atraumatic. There is trismus in the jaw.    Mouth/Throat: Uvula is midline. Dental abscesses present. No edematous. No oropharyngeal exudate.    Eyes: Conjunctivae are normal. No scleral icterus.  Neck: Normal range of motion.  Cardiovascular: Normal rate, regular rhythm and normal heart sounds.  Exam reveals no gallop and no friction rub.   No murmur heard. Pulmonary/Chest: Effort normal and breath sounds normal. No respiratory distress.  Abdominal: Soft. Bowel sounds are normal. She exhibits no distension and no mass. There is no tenderness. There is no guarding.  Neurological: She is alert and oriented to person, place, and time.  Skin: Skin is warm and dry. She is not diaphoretic.    ED Course   Procedures (including critical care time)  Labs Reviewed - No data to display No results found. 1. Pain, dental    2. Periapical abscess with facial involvement   3. Tooth decay     MDM  11:09 AM BP 112/73  Pulse 78  Temp(Src) 100.2 F (37.9 C) (Oral)  Resp 18  SpO2 97%  LMP 06/01/2013 Patient with facial swelling, fever, pain with swallowing concern for ludwig's/ pharyngeal abscess. Labs, preg test, ct maxillofacial and soft tissue neck.   1:16 PM Patient CT negative for Ludwig's angina or pharyngeal abscess.  She does have a very apical abscess with facial swelling involvement at tooth 2 through 4 on CT. I personally reviewed the imaging using our PACS system. Patient will be changed to clindamycin.  She has dental followup per her personal dentist.  Patient requests pain medication change.  She'll be given Percocet at discharge. The patient appears reasonably screened and/or stabilized for discharge and I doubt any other medical condition or other [facility] requiring further screening, evaluation, or treatment in the ED at this time prior to discharge.   Arthor Captain, PA-C 06/12/13 2202

## 2013-06-13 NOTE — ED Provider Notes (Signed)
Medical screening examination/treatment/procedure(s) were performed by non-physician practitioner and as supervising physician I was immediately available for consultation/collaboration.   Laray Anger, DO 06/13/13 2205

## 2013-09-06 ENCOUNTER — Other Ambulatory Visit: Payer: Self-pay

## 2014-09-02 ENCOUNTER — Encounter (HOSPITAL_COMMUNITY): Payer: Self-pay

## 2014-12-08 ENCOUNTER — Encounter (HOSPITAL_COMMUNITY): Payer: Self-pay

## 2014-12-08 ENCOUNTER — Emergency Department (HOSPITAL_COMMUNITY)
Admission: EM | Admit: 2014-12-08 | Discharge: 2014-12-08 | Disposition: A | Discharge status: Home / Self Care | Payer: BLUE CROSS/BLUE SHIELD | Attending: Emergency Medicine | Admitting: Emergency Medicine

## 2014-12-08 ENCOUNTER — Emergency Department (HOSPITAL_COMMUNITY): Payer: BLUE CROSS/BLUE SHIELD

## 2014-12-08 DIAGNOSIS — I10 Essential (primary) hypertension: Secondary | ICD-10-CM | POA: Insufficient documentation

## 2014-12-08 DIAGNOSIS — J069 Acute upper respiratory infection, unspecified: Secondary | ICD-10-CM | POA: Diagnosis not present

## 2014-12-08 DIAGNOSIS — Z7982 Long term (current) use of aspirin: Secondary | ICD-10-CM | POA: Insufficient documentation

## 2014-12-08 DIAGNOSIS — G478 Other sleep disorders: Secondary | ICD-10-CM | POA: Diagnosis not present

## 2014-12-08 DIAGNOSIS — Z792 Long term (current) use of antibiotics: Secondary | ICD-10-CM | POA: Insufficient documentation

## 2014-12-08 DIAGNOSIS — Z8701 Personal history of pneumonia (recurrent): Secondary | ICD-10-CM | POA: Diagnosis not present

## 2014-12-08 DIAGNOSIS — M542 Cervicalgia: Secondary | ICD-10-CM | POA: Diagnosis not present

## 2014-12-08 DIAGNOSIS — R05 Cough: Secondary | ICD-10-CM | POA: Diagnosis present

## 2014-12-08 MED ORDER — ACETAMINOPHEN 325 MG PO TABS
650.0000 mg | ORAL_TABLET | Freq: Once | ORAL | Status: AC
  Administered 2014-12-08: 650 mg via ORAL
  Filled 2014-12-08: qty 2

## 2014-12-08 MED ORDER — HYDROCODONE-HOMATROPINE 5-1.5 MG/5ML PO SYRP: 5.0000 mL | ORAL_SOLUTION | Freq: Four times a day (QID) | ORAL | Status: DC | PRN

## 2014-12-08 MED ORDER — IBUPROFEN 200 MG PO TABS
600.0000 mg | ORAL_TABLET | Freq: Once | ORAL | Status: AC
  Administered 2014-12-08: 600 mg via ORAL
  Filled 2014-12-08: qty 3

## 2014-12-08 NOTE — ED Notes (Signed)
Patient reports she has had a fever, cough, chest pain with inspiration for one week.  She was seen and treated for pneumonia at urgent care on Wednesday.  Patient states she has been taking medications as prescribed (Clarithromycin, Pro-Air, Bromfed.  She reports she cannot tell any improvement since Wednesday.  She reports she is unable to sleep because of pain.

## 2014-12-08 NOTE — ED Provider Notes (Signed)
Patient care acquired from Harle BattiestElizabeth Tysinger, NP pending CXR results.  Results for orders placed or performed during the hospital encounter of 06/11/13  I-STAT, chem 8  Result Value Ref Range   Sodium 138 135 - 145 mEq/L   Potassium 4.4 3.5 - 5.1 mEq/L   Chloride 104 96 - 112 mEq/L   BUN 11 6 - 23 mg/dL   Creatinine, Ser 1.610.60 0.50 - 1.10 mg/dL   Glucose, Bld 97 70 - 99 mg/dL   Calcium, Ion 0.961.14 0.451.12 - 1.23 mmol/L   TCO2 24 0 - 100 mmol/L   Hemoglobin 13.9 12.0 - 15.0 g/dL   HCT 40.941.0 81.136.0 - 91.446.0 %  Pregnancy, urine POC  Result Value Ref Range   Preg Test, Ur NEGATIVE NEGATIVE   Dg Chest 2 View  12/08/2014   CLINICAL DATA:  Clinical diagnosis of pneumonia 3 days ago, chest pain  EXAM: CHEST  2 VIEW  COMPARISON:  None.  FINDINGS: The heart size and mediastinal contours are within normal limits. Both lungs are clear. The visualized skeletal structures are unremarkable.  IMPRESSION: No active cardiopulmonary disease.   Electronically Signed   By: Christiana PellantGretchen  Green M.D.   On: 12/08/2014 20:57   1. URI (upper respiratory infection)    Filed Vitals:   12/08/14 1917  BP: 137/81  Pulse: 105  Temp: 98.6 F (37 C)  Resp: 18   Afebrile, NAD, non-toxic appearing, AAOx4.  I have reviewed nursing notes, vital signs, and all appropriate lab and imaging results for this patient. No evidence of PNA on CXR. Advised patient to finish course of antibiotic. Hycodan will be added to symptomatically regimen. Advised PCP follow-up. Return precautions discussed. Patient is agreeable to plan and stable at time of discharge.  Jeannetta EllisJennifer L Carlon Chaloux, PA-C 12/08/14 2125  Rolan BuccoMelanie Belfi, MD 12/08/14 2156

## 2014-12-08 NOTE — Discharge Instructions (Signed)
Please follow up with your primary care physician in 1-2 days. If you do not have one please call the [facility] number listed above. Please take pain medication and/or muscle relaxants as prescribed and as needed for pain. Please do not drive on narcotic pain medication or on muscle relaxants. Please finish your antibiotic course. Please read all discharge instructions and return precautions.   Pneumonia Pneumonia is an infection of the lungs.  CAUSES Pneumonia may be caused by bacteria or a virus. Usually, these infections are caused by breathing infectious particles into the lungs (respiratory tract). SIGNS AND SYMPTOMS   Cough.  Fever.  Chest pain.  Increased rate of breathing.  Wheezing.  Mucus production. DIAGNOSIS  If you have the common symptoms of pneumonia, your health care provider will typically confirm the diagnosis with a chest X-ray. The X-ray will show an abnormality in the lung (pulmonary infiltrate) if you have pneumonia. Other tests of your blood, urine, or sputum may be done to find the specific cause of your pneumonia. Your health care provider may also do tests (blood gases or pulse oximetry) to see how well your lungs are working. TREATMENT  Some forms of pneumonia may be spread to other people when you cough or sneeze. You may be asked to wear a mask before and during your exam. Pneumonia that is caused by bacteria is treated with antibiotic medicine. Pneumonia that is caused by the influenza virus may be treated with an antiviral medicine. Most other viral infections must run their course. These infections will not respond to antibiotics.  HOME CARE INSTRUCTIONS   Cough suppressants may be used if you are losing too much rest. However, coughing protects you by clearing your lungs. You should avoid using cough suppressants if you can.  Your health care provider may have prescribed medicine if he or she thinks your pneumonia is caused by bacteria  or influenza. Finish your medicine even if you start to feel better.  Your health care provider may also prescribe an expectorant. This loosens the mucus to be coughed up.  Take medicines only as directed by your health care provider.  Do not smoke. Smoking is a common cause of bronchitis and can contribute to pneumonia. If you are a smoker and continue to smoke, your cough may last several weeks after your pneumonia has cleared.  A cold steam vaporizer or humidifier in your room or home may help loosen mucus.  Coughing is often worse at night. Sleeping in a semi-upright position in a recliner or using a couple pillows under your head will help with this.  Get rest as you feel it is needed. Your body will usually let you know when you need to rest. PREVENTION A pneumococcal shot (vaccine) is available to prevent a common bacterial cause of pneumonia. This is usually suggested for:  People over 45 years old.  Patients on chemotherapy.  People with chronic lung problems, such as bronchitis or emphysema.  People with immune system problems. If you are over 65 or have a high risk condition, you may receive the pneumococcal vaccine if you have not received it before. In some countries, a routine influenza vaccine is also recommended. This vaccine can help prevent some cases of pneumonia.You may be offered the influenza vaccine as part of your care. If you smoke, it is time to quit. You may receive instructions on how to stop smoking. Your health care provider can provide medicines and counseling to help you quit.  SEEK MEDICAL CARE IF: You have a fever. SEEK IMMEDIATE MEDICAL CARE IF:   Your illness becomes worse. This is especially true if you are elderly or weakened from any other disease.  You cannot control your cough with suppressants and are losing sleep.  You begin coughing up blood.  You develop pain which is getting worse or is uncontrolled with medicines.  Any of the  symptoms which initially brought you in for treatment are getting worse rather than better.  You develop shortness of breath or chest pain. MAKE SURE YOU:   Understand these instructions.  Will watch your condition.  Will get help right away if you are not doing well or get worse. Document Released: 10/18/2005 Document Revised: 03/04/2014 Document Reviewed: 01/07/2011 [facility] Patient Information 2015 [place], [place]. This information is not intended to replace advice given to you by your health care provider. Make sure you discuss any questions you have with your health care provider.

## 2014-12-08 NOTE — ED Provider Notes (Signed)
CSN: [id number]     Arrival date & time 12/08/14  1914 History  This chart was scribed for non-physician practitioner, Harle BattiestElizabeth Anna Beaird, NP-C, working with Rolan BuccoMelanie Belfi, MD by Milly JakobJohn Lee Graves, ED Scribe. The patient was seen in room WTR7/WTR7. Patient's care was started at 7:40 PM.    Chief Complaint  Patient presents with  . Pneumonia   The history is provided by the patient. No language interpreter was used.   HPI Comments: Ashley Adams is a 44 y.o. female who presents to the Emergency Department complaining of right-sided neck soreness and burning pain with breathing which have become gradually worse since she was diagnosed with pneumonia at [facility] 5 days ago. She states that her X-ray showed pneumonia in her right lung. She states that they placed her on clarithromycin, an albuterol NDI, and a multi-symptom cold medicine which she has taken without relief. She additionally reports a productive cough, generalized weakness, apatite reduction, and difficulty sleeping due to discomfort. She reports taking 2 Ibuprofen every 4 hours until stopping today, without relief. She is currently on her menstrual period.   Past Medical History  Diagnosis Date  . Hypertension   . Sleep apnea    History reviewed. No pertinent past surgical history. Family History  Problem Relation Age of Onset  . Hypertension Father   . Hypertension Other    History  Substance Use Topics  . Smoking status: Never Smoker   . Smokeless tobacco: Not on file  . Alcohol Use: No   OB History    Gravida Para Term Preterm AB TAB SAB Ectopic Multiple Living   2 2 2  0 0 0 0 0 0 2     Review of Systems  Constitutional: Positive for appetite change.  Respiratory: Positive for cough and shortness of breath.   Musculoskeletal: Positive for neck pain.  Psychiatric/Behavioral: Positive for sleep disturbance.   Allergies  Review of patient's allergies indicates no known allergies.  Home Medications    Prior to Admission medications   Medication Sig Start Date End Date Taking? Authorizing Provider  aspirin 81 MG chewable tablet Chew 81 mg by mouth daily.    Historical Provider, MD  clindamycin (CLEOCIN) 150 MG capsule Take 2 capsules (300 mg total) by mouth every 6 (six) hours. 06/11/13   Arthor CaptainAbigail Harris, PA-C  ibuprofen (ADVIL,MOTRIN) 200 MG tablet Take 800 mg by mouth every 8 (eight) hours as needed for pain.    Historical Provider, MD  oxyCODONE-acetaminophen (PERCOCET) 10-325 MG per tablet Take 1 tablet by mouth every 4 (four) hours as needed for pain. 06/11/13   Arthor CaptainAbigail Harris, PA-C   Triage Vitals: BP 137/81 mmHg  Pulse 105  Temp(Src) 98.6 F (37 C) (Oral)  Resp 18  Ht 5\' 7"  (1.702 m)  Wt 247 lb (112.038 kg)  BMI 38.68 kg/m2  SpO2 98%  LMP 12/06/2014 Physical Exam  Constitutional: She is oriented to person, place, and time. She appears well-developed and well-nourished. No distress.  HENT:  Head: Normocephalic and atraumatic.  Eyes: Conjunctivae and EOM are normal.  Neck: Neck supple. No tracheal deviation present.  Tenderness to palpation to right sternocleidomastoid muscle. No nuchal rigidity. Good ROM.  Cardiovascular: Normal rate, regular rhythm and normal heart sounds.   No murmur heard. Pulmonary/Chest: Effort normal. No respiratory distress. She has no wheezes. She has no rales. She exhibits no tenderness.  Diminished air movement in right upper lung field.  Abdominal: Soft. There is no tenderness.  Musculoskeletal: Normal range  of motion.  Neurological: She is alert and oriented to person, place, and time.  Skin: Skin is warm and dry.  Psychiatric: She has a normal mood and affect. Her behavior is normal.  Nursing note and vitals reviewed.   ED Course  Procedures (including critical care time) DIAGNOSTIC STUDIES: Oxygen Saturation is 98% on room air, normal by my interpretation.    COORDINATION OF CARE: 7:51 PM-Discussed treatment plan which includes CXR,  and OTC Tylenol and Ibuprofen with pt at bedside and pt agreed to plan.   Labs Review Labs Reviewed - No data to display  Imaging Review No results found.   EKG Interpretation None      MDM   Final diagnoses:  None   44 yo with continued cough and chills despite treatment for PNA.  Will cxr to re-evaluate lungs and treat with NSAIDs.  8:05 PM: At end-of shift, hand-off report given to [employer], PA-C.  Plan includes re-eval after cxr and determine change in treatment plan.  I personally performed the services described in this documentation, which was scribed in my presence. The recorded information has been reviewed and is accurate.      Harle Battiest, NP 12/14/14 0030  Rolan Bucco, MD 12/18/14 [phone]

## 2018-02-01 ENCOUNTER — Ambulatory Visit (INDEPENDENT_AMBULATORY_CARE_PROVIDER_SITE_OTHER): Payer: BLUE CROSS/BLUE SHIELD

## 2018-02-01 ENCOUNTER — Ambulatory Visit: Payer: BLUE CROSS/BLUE SHIELD | Admitting: Emergency Medicine

## 2018-02-01 ENCOUNTER — Encounter: Payer: Self-pay | Admitting: Emergency Medicine

## 2018-02-01 ENCOUNTER — Other Ambulatory Visit: Payer: Self-pay

## 2018-02-01 VITALS — BP 114/76 | HR 94 | Temp 98.9°F | Resp 16 | Ht 67.5 in | Wt 259.0 lb

## 2018-02-01 DIAGNOSIS — R1084 Generalized abdominal pain: Secondary | ICD-10-CM | POA: Insufficient documentation

## 2018-02-01 DIAGNOSIS — K5900 Constipation, unspecified: Secondary | ICD-10-CM | POA: Insufficient documentation

## 2018-02-01 LAB — POCT URINALYSIS DIP (MANUAL ENTRY)
Glucose, UA: NEGATIVE mg/dL
LEUKOCYTES UA: NEGATIVE
NITRITE UA: POSITIVE — AB
PH UA: 5.5 (ref 5.0–8.0)
Spec Grav, UA: >=1.03 — AB (ref 1.010–1.025)
UROBILINOGEN UA: 2 U/dL — AB

## 2018-02-01 LAB — POCT CBC
Granulocyte percent: 62.5 %G (ref 37–80)
HCT, POC: 34.5 % — AB (ref 37.7–47.9)
HEMOGLOBIN: 10.9 g/dL — AB (ref 12.2–16.2)
LYMPH, POC: 1.5 (ref 0.6–3.4)
MCH, POC: 27.4 pg (ref 27–31.2)
MCHC: 31.5 g/dL — AB (ref 31.8–35.4)
MCV: 86.9 fL (ref 80–97)
MID (cbc): 0.4 (ref 0–0.9)
MPV: 8.1 fL (ref 0–99.8)
PLATELET COUNT, POC: 286 10*3/uL (ref 142–424)
POC Granulocyte: 3.1 (ref 2–6.9)
POC LYMPH PERCENT: 30.2 %L (ref 10–50)
POC MID %: 7.3 %M (ref 0–12)
RBC: 3.97 M/uL — AB (ref 4.04–5.48)
RDW, POC: 20.9 %
WBC: 5 10*3/uL (ref 4.6–10.2)

## 2018-02-01 LAB — POCT URINE PREGNANCY: PREG TEST UR: NEGATIVE

## 2018-02-01 MED ORDER — SENNOSIDES-DOCUSATE SODIUM 8.6-50 MG PO TABS: 1.0000 | ORAL_TABLET | Freq: Two times a day (BID) | ORAL | 0 refills | Status: AC

## 2018-02-01 MED ORDER — POLYETHYLENE GLYCOL 3350 17 GM/SCOOP PO POWD: 17.0000 g | Freq: Every day | ORAL | 0 refills | Status: AC

## 2018-02-01 NOTE — Assessment & Plan Note (Signed)
Benign abdomen.  No red flag signs or symptoms.  Able to eat and drink.  No fever or chills.  White count normal.  Pregnancy test negative. Ectopic pregnancy ruled out.  X-rays show a moderate amount of stools.  No obstruction.  No free air.  Urinalysis shows blood most likely from her menses. Clinical picture not compatible with a kidney stone.  Has no urinary symptoms or UTI.  Diverticulitis possible but not likely.  Constipation is likely the cause of her pain.  Medication for constipation prescribed.  Appendicitis is still a possibility.  Advised to return or go to the ER if symptoms worsen.

## 2018-02-01 NOTE — Patient Instructions (Addendum)
     IF you received an x-ray today, you will receive an invoice from [facility]. Please contact [facility] at [phone] with questions or concerns regarding your invoice.   IF you received labwork today, you will receive an invoice from [facility]. Please contact [facility] at [phone] with questions or concerns regarding your invoice.   Our billing staff will not be able to assist you with questions regarding bills from these companies.  You will be contacted with the lab results as soon as they are available. The fastest way to get your results is to activate your My Chart account. Instructions are located on the last page of this paperwork. If you have not heard from us regarding the results in 2 weeks, please contact this office.     Constipation, Adult Constipation is when a person:  Poops (has a bowel movement) fewer times in a week than normal.  Has a hard time pooping.  Has poop that is dry, hard, or bigger than normal.  Follow these instructions at home: Eating and drinking   Eat foods that have a lot of fiber, such as: ? Fresh fruits and vegetables. ? Whole grains. ? Beans.  Eat less of foods that are high in fat, low in fiber, or overly processed, such as: ? [place] fries. ? Hamburgers. ? Cookies. ? Candy. ? Soda.  Drink enough fluid to keep your pee (urine) clear or pale yellow. General instructions  Exercise regularly or as told by your doctor.  Go to the restroom when you feel like you need to poop. Do not hold it in.  Take over-the-counter and prescription medicines only as told by your doctor. These include any fiber supplements.  Do pelvic floor retraining exercises, such as: ? Doing deep breathing while relaxing your lower belly (abdomen). ? Relaxing your pelvic floor while pooping.  Watch your condition for any changes.  Keep all follow-up visits as told by your doctor. This is important. Contact a doctor if:  You have  pain that gets worse.  You have a fever.  You have not pooped for 4 days.  You throw up (vomit).  You are not hungry.  You lose weight.  You are bleeding from the anus.  You have thin, pencil-like poop (stool). Get help right away if:  You have a fever, and your symptoms suddenly get worse.  You leak poop or have blood in your poop.  Your belly feels hard or bigger than normal (is bloated).  You have very bad belly pain.  You feel dizzy or you faint. This information is not intended to replace advice given to you by your health care provider. Make sure you discuss any questions you have with your health care provider. Document Released: 04/05/2008 Document Revised: 05/07/2016 Document Reviewed: 04/07/2016 [employer] Interactive Patient Education  2018 [employer]

## 2018-02-01 NOTE — Progress Notes (Signed)
Ashley Adams 47 y.o.   Chief Complaint  Patient presents with  . Abdominal Pain    PER PATIENT IT MIGHT BE CONSTIPATION  x 3 days    HISTORY OF PRESENT ILLNESS: This is a 47 y.o. female complaining of abdominal pain since last Monday, 2 days ago.  Feels nauseous but no vomiting.  Passing a lot of gas.  Has increased her consumption of corn.  Denies fever or chills.  Also complaining of constipation.  Still able to eat and drink.  No history of abdominal surgeries.  No new medications.  No other significant symptoms.  Having her period at the present time.  Has no urinary symptoms.  Has been taking several diet drinks for the past 1-2 weeks.  HPI   Prior to Admission medications   Medication Sig Start Date End Date Taking? Authorizing Provider  aspirin 81 MG chewable tablet Chew 81 mg by mouth daily.   Yes [provider]  ibuprofen (ADVIL,MOTRIN) 200 MG tablet Take 800 mg by mouth every 8 (eight) hours as needed for pain.   Yes [provider]  Vitamin D, Ergocalciferol, (DRISDOL) 50000 units CAPS capsule Take 50,000 Units by mouth every other day.   Yes [provider]  clindamycin (CLEOCIN) 150 MG capsule Take 2 capsules (300 mg total) by mouth every 6 (six) hours. Patient not taking: Reported on 02/01/2018 06/11/13   Arthor Captain, PA-C  oxyCODONE-acetaminophen (PERCOCET) 10-325 MG per tablet Take 1 tablet by mouth every 4 (four) hours as needed for pain. Patient not taking: Reported on 02/01/2018 06/11/13   Arthor Captain, PA-C    No Known Allergies  Patient Active Problem List   Diagnosis Date Noted  . MORBID OBESITY 12/07/2007  . GASTROESOPHAGEAL REFLUX, NO ESOPHAGITIS 12/29/2006    Past Medical History:  Diagnosis Date  . Hypertension   . Sleep apnea     No past surgical history on file.  Social History   Socioeconomic History  . Marital status: Single    Spouse name: Not on file  . Number of children: Not on file  . Years of  education: Not on file  . Highest education level: Not on file  Occupational History  . Not on file  Social Needs  . Financial resource strain: Not on file  . Food insecurity:    Worry: Not on file    Inability: Not on file  . Transportation needs:    Medical: Not on file    Non-medical: Not on file  Tobacco Use  . Smoking status: Never Smoker  . Smokeless tobacco: Never Used  Substance and Sexual Activity  . Alcohol use: No  . Drug use: No  . Sexual activity: Yes  Lifestyle  . Physical activity:    Days per week: Not on file    Minutes per session: Not on file  . Stress: Not on file  Relationships  . Social connections:    Talks on phone: Not on file    Gets together: Not on file    Attends religious service: Not on file    Active member of club or organization: Not on file    Attends meetings of clubs or organizations: Not on file    Relationship status: Not on file  . Intimate partner violence:    Fear of current or ex partner: Not on file    Emotionally abused: Not on file    Physically abused: Not on file    Forced sexual activity: Not  on file  Other Topics Concern  . Not on file  Social History Narrative  . Not on file    Family History  Problem Relation Age of Onset  . Hypertension Father   . Hypertension Other      Review of Systems  Constitutional: Negative.  Negative for chills and fever.  HENT: Negative.  Negative for congestion, sinus pain and sore throat.   Eyes: Negative.   Respiratory: Negative.  Negative for cough and shortness of breath.   Cardiovascular: Negative.  Negative for chest pain.  Gastrointestinal: Positive for abdominal pain, constipation and nausea. Negative for blood in stool, diarrhea, heartburn, melena and vomiting.  Genitourinary: Negative.  Negative for dysuria, frequency, hematuria and urgency.  Musculoskeletal: Negative.   Skin: Negative.  Negative for rash.  Neurological: Negative.  Negative for dizziness and headaches.   Endo/Heme/Allergies: Negative.   All other systems reviewed and are negative.     Vitals:   02/01/18 1527  BP: 114/76  Pulse: 94  Resp: 16  Temp: 98.9 F (37.2 C)  SpO2: 100%    Physical Exam  Constitutional: She is oriented to person, place, and time. She appears well-developed.  Obese  HENT:  Head: Normocephalic and atraumatic.  Nose: Nose normal.  Mouth/Throat: Oropharynx is clear and moist.  Eyes: Pupils are equal, round, and reactive to light. Conjunctivae and EOM are normal.  Neck: Normal range of motion. Neck supple.  Cardiovascular: Normal rate, regular rhythm and normal heart sounds.  Pulmonary/Chest: Effort normal and breath sounds normal.  Abdominal: Soft. Bowel sounds are normal. She exhibits no distension and no mass. There is tenderness (Diffuse mild tenderness). There is no rebound and no guarding. No hernia.  Musculoskeletal: Normal range of motion.  Neurological: She is alert and oriented to person, place, and time. No sensory deficit. She exhibits normal muscle tone. Coordination normal.  Skin: Skin is warm and dry. Capillary refill takes less than 2 seconds.  Psychiatric: She has a normal mood and affect. Her behavior is normal.   Results for orders placed or performed in visit on 02/01/18 (from the past 24 hour(s))  POCT urine pregnancy     Status: None   Collection Time: 02/01/18  3:51 PM  Result Value Ref Range   Preg Test, Ur Negative Negative  POCT urinalysis dipstick     Status: Abnormal   Collection Time: 02/01/18  3:52 PM  Result Value Ref Range   Color, UA red (A) yellow   Clarity, UA cloudy (A) clear   Glucose, UA negative negative mg/dL   Bilirubin, UA large (A) negative   Ketones, POC UA small (15) (A) negative mg/dL   Spec Grav, UA >=1.610 (A) 1.010 - 1.025   Blood, UA large (A) negative   pH, UA 5.5 5.0 - 8.0   Protein Ur, POC >=300 (A) negative mg/dL   Urobilinogen, UA 2.0 (A) 0.2 or 1.0 E.U./dL   Nitrite, UA Positive (A) Negative     Leukocytes, UA Negative Negative  POCT CBC     Status: Abnormal   Collection Time: 02/01/18  3:56 PM  Result Value Ref Range   WBC 5.0 4.6 - 10.2 K/uL   Lymph, poc 1.5 0.6 - 3.4   POC LYMPH PERCENT 30.2 10 - 50 %L   MID (cbc) 0.4 0 - 0.9   POC MID % 7.3 0 - 12 %M   POC Granulocyte 3.1 2 - 6.9   Granulocyte percent 62.5 37 - 80 %G   RBC  3.97 (A) 4.04 - 5.48 M/uL   Hemoglobin 10.9 (A) 12.2 - 16.2 g/dL   HCT, POC 81.1 (A) 91.4 - 47.9 %   MCV 86.9 80 - 97 fL   MCH, POC 27.4 27 - 31.2 pg   MCHC 31.5 (A) 31.8 - 35.4 g/dL   RDW, POC 78.2 %   Platelet Count, POC 286 142 - 424 K/uL   MPV 8.1 0 - 99.8 fL    Dg Abd Acute W/chest  Result Date: 02/01/2018 CLINICAL DATA:  Generalized abdominal pain. EXAM: DG ABDOMEN ACUTE W/ 1V CHEST COMPARISON:  Chest radiographs 12/08/2014 FINDINGS: The cardiomediastinal silhouette is within normal limits. No airspace consolidation, edema, pleural effusion, or pneumothorax is identified. No intraperitoneal free air is identified. A moderate amount of stool is present throughout the colon. No dilated loops of bowel are seen to suggest obstruction. A small calcification in the left lower pelvis likely represents a phlebolith. No acute osseous abnormality is identified. IMPRESSION: 1. Moderate colonic stool volume without evidence of obstruction. 2. No evidence of acute cardiopulmonary process. Electronically Signed   By: Sebastian Ache M.D.   On: 02/01/2018 16:18   Generalized abdominal pain Benign abdomen.  No red flag signs or symptoms.  Able to eat and drink.  No fever or chills.  White count normal.  Pregnancy test negative. Ectopic pregnancy ruled out.  X-rays show a moderate amount of stools.  No obstruction.  No free air.  Urinalysis shows blood most likely from her menses. Clinical picture not compatible with a kidney stone.  Has no urinary symptoms or UTI.  Diverticulitis possible but not likely.  Constipation is likely the cause of her pain.  Medication for  constipation prescribed.  Appendicitis is still a possibility.  Advised to return or go to the ER if symptoms worsen.    ASSESSMENT & PLAN: Ketzaly was seen today for abdominal pain.  Diagnoses and all orders for this visit:  Generalized abdominal pain -     POCT CBC -     POCT urinalysis dipstick -     POCT urine pregnancy -     Comprehensive metabolic panel -     DG Abd Acute W/Chest; Future -     Urine Culture  Constipation, unspecified constipation type -     polyethylene glycol powder (GLYCOLAX/MIRALAX) powder; Take 17 g by mouth daily for 3 days. -     senna-docusate (SENOKOT-S) 8.6-50 MG tablet; Take 1 tablet by mouth 2 (two) times daily for 3 days.    Patient Instructions       IF you received an x-ray today, you will receive an invoice from [facility]. Please contact [facility] at [phone] with questions or concerns regarding your invoice.   IF you received labwork today, you will receive an invoice from [place]. Please contact [facility] at [phone] with questions or concerns regarding your invoice.   Our billing staff will not be able to assist you with questions regarding bills from these companies.  You will be contacted with the lab results as soon as they are available. The fastest way to get your results is to activate your My Chart account. Instructions are located on the last page of this paperwork. If you have not heard from [place] regarding the results in 2 weeks, please contact this office.     Constipation, Adult Constipation is when a person:  Poops (has a bowel movement) fewer times in a week than normal.  Has a hard time pooping.  Has poop that is dry, hard, or bigger than normal.  Follow these instructions at home: Eating and drinking   Eat foods that have a lot of fiber, such as: ? Fresh fruits and vegetables. ? Whole grains. ? Beans.  Eat less of foods that are high in fat, low in fiber, or overly processed,  such as: ? [place] fries. ? Hamburgers. ? Cookies. ? Candy. ? Soda.  Drink enough fluid to keep your pee (urine) clear or pale yellow. General instructions  Exercise regularly or as told by your doctor.  Go to the restroom when you feel like you need to poop. Do not hold it in.  Take over-the-counter and prescription medicines only as told by your doctor. These include any fiber supplements.  Do pelvic floor retraining exercises, such as: ? Doing deep breathing while relaxing your lower belly (abdomen). ? Relaxing your pelvic floor while pooping.  Watch your condition for any changes.  Keep all follow-up visits as told by your doctor. This is important. Contact a doctor if:  You have pain that gets worse.  You have a fever.  You have not pooped for 4 days.  You throw up (vomit).  You are not hungry.  You lose weight.  You are bleeding from the anus.  You have thin, pencil-like poop (stool). Get help right away if:  You have a fever, and your symptoms suddenly get worse.  You leak poop or have blood in your poop.  Your belly feels hard or bigger than normal (is bloated).  You have very bad belly pain.  You feel dizzy or you faint. This information is not intended to replace advice given to you by your health care provider. Make sure you discuss any questions you have with your health care provider. Document Released: 04/05/2008 Document Revised: 05/07/2016 Document Reviewed: 04/07/2016 Elsevier Interactive Patient Education  2018 Elsevier Inc.      Edwina BarthMiguel Theone Bowell, MD [facility]

## 2018-02-02 LAB — COMPREHENSIVE METABOLIC PANEL
ALK PHOS: 72 IU/L (ref 39–117)
ALT: 13 IU/L (ref 0–32)
AST: 21 IU/L (ref 0–40)
Albumin/Globulin Ratio: 1.4 (ref 1.2–2.2)
Albumin: 4.3 g/dL (ref 3.5–5.5)
BILIRUBIN TOTAL: 0.5 mg/dL (ref 0.0–1.2)
BUN/Creatinine Ratio: 14 (ref 9–23)
BUN: 11 mg/dL (ref 6–24)
CHLORIDE: 106 mmol/L (ref 96–106)
CO2: 17 mmol/L — AB (ref 20–29)
CREATININE: 0.77 mg/dL (ref 0.57–1.00)
Calcium: 9.3 mg/dL (ref 8.7–10.2)
GFR calc Af Amer: 106 mL/min/{1.73_m2} (ref >59)
GFR calc non Af Amer: 92 mL/min/{1.73_m2} (ref >59)
GLOBULIN, TOTAL: 3.1 g/dL (ref 1.5–4.5)
Glucose: 107 mg/dL — ABNORMAL HIGH (ref 65–99)
Potassium: 4 mmol/L (ref 3.5–5.2)
SODIUM: 139 mmol/L (ref 134–144)
Total Protein: 7.4 g/dL (ref 6.0–8.5)

## 2018-02-02 LAB — URINE CULTURE

## 2018-02-03 ENCOUNTER — Encounter: Payer: Self-pay | Admitting: *Deleted

## 2020-09-28 ENCOUNTER — Encounter (HOSPITAL_COMMUNITY): Payer: Self-pay | Admitting: Emergency Medicine

## 2020-09-28 ENCOUNTER — Inpatient Hospital Stay (HOSPITAL_COMMUNITY)
Admission: EM | Admit: 2020-09-28 | Discharge: 2020-10-07 | DRG: 871 | Disposition: A | Discharge status: Home / Self Care | Payer: BC Managed Care – PPO | Attending: Internal Medicine | Admitting: Internal Medicine

## 2020-09-28 ENCOUNTER — Other Ambulatory Visit: Payer: Self-pay

## 2020-09-28 ENCOUNTER — Emergency Department (HOSPITAL_COMMUNITY): Payer: BC Managed Care – PPO

## 2020-09-28 DIAGNOSIS — J9601 Acute respiratory failure with hypoxia: Secondary | ICD-10-CM | POA: Diagnosis present

## 2020-09-28 DIAGNOSIS — N393 Stress incontinence (female) (male): Secondary | ICD-10-CM | POA: Diagnosis present

## 2020-09-28 DIAGNOSIS — E669 Obesity, unspecified: Secondary | ICD-10-CM | POA: Diagnosis present

## 2020-09-28 DIAGNOSIS — Z6837 Body mass index (BMI) 37.0-37.9, adult: Secondary | ICD-10-CM

## 2020-09-28 DIAGNOSIS — E876 Hypokalemia: Secondary | ICD-10-CM

## 2020-09-28 DIAGNOSIS — I10 Essential (primary) hypertension: Secondary | ICD-10-CM | POA: Diagnosis present

## 2020-09-28 DIAGNOSIS — A4189 Other specified sepsis: Principal | ICD-10-CM | POA: Diagnosis present

## 2020-09-28 DIAGNOSIS — R509 Fever, unspecified: Secondary | ICD-10-CM | POA: Diagnosis not present

## 2020-09-28 DIAGNOSIS — G4733 Obstructive sleep apnea (adult) (pediatric): Secondary | ICD-10-CM | POA: Diagnosis present

## 2020-09-28 DIAGNOSIS — R7989 Other specified abnormal findings of blood chemistry: Secondary | ICD-10-CM | POA: Diagnosis present

## 2020-09-28 DIAGNOSIS — A419 Sepsis, unspecified organism: Secondary | ICD-10-CM

## 2020-09-28 DIAGNOSIS — R7401 Elevation of levels of liver transaminase levels: Secondary | ICD-10-CM

## 2020-09-28 DIAGNOSIS — U071 COVID-19: Secondary | ICD-10-CM

## 2020-09-28 DIAGNOSIS — Z79899 Other long term (current) drug therapy: Secondary | ICD-10-CM

## 2020-09-28 DIAGNOSIS — J1282 Pneumonia due to coronavirus disease 2019: Secondary | ICD-10-CM | POA: Diagnosis present

## 2020-09-28 DIAGNOSIS — Z8249 Family history of ischemic heart disease and other diseases of the circulatory system: Secondary | ICD-10-CM

## 2020-09-28 DIAGNOSIS — Z7982 Long term (current) use of aspirin: Secondary | ICD-10-CM

## 2020-09-28 DIAGNOSIS — D649 Anemia, unspecified: Secondary | ICD-10-CM | POA: Diagnosis present

## 2020-09-28 LAB — COMPREHENSIVE METABOLIC PANEL
ALT: 46 U/L — ABNORMAL HIGH (ref 0–44)
AST: 89 U/L — ABNORMAL HIGH (ref 15–41)
Albumin: 3.2 g/dL — ABNORMAL LOW (ref 3.5–5.0)
Alkaline Phosphatase: 49 U/L (ref 38–126)
Anion gap: 13 (ref 5–15)
BUN: 13 mg/dL (ref 6–20)
CO2: 27 mmol/L (ref 22–32)
Calcium: 8.4 mg/dL — ABNORMAL LOW (ref 8.9–10.3)
Chloride: 99 mmol/L (ref 98–111)
Creatinine, Ser: 0.7 mg/dL (ref 0.44–1.00)
GFR, Estimated: >60 mL/min (ref >60)
Glucose, Bld: 141 mg/dL — ABNORMAL HIGH (ref 70–99)
Potassium: 3.2 mmol/L — ABNORMAL LOW (ref 3.5–5.1)
Sodium: 139 mmol/L (ref 135–145)
Total Bilirubin: 0.5 mg/dL (ref 0.3–1.2)
Total Protein: 6.9 g/dL (ref 6.5–8.1)

## 2020-09-28 LAB — CBC WITH DIFFERENTIAL/PLATELET
Abs Immature Granulocytes: 0.03 10*3/uL (ref 0.00–0.07)
Basophils Absolute: 0 10*3/uL (ref 0.0–0.1)
Basophils Relative: 0 %
Eosinophils Absolute: 0 10*3/uL (ref 0.0–0.5)
Eosinophils Relative: 0 %
HCT: 35.4 % — ABNORMAL LOW (ref 36.0–46.0)
Hemoglobin: 11.4 g/dL — ABNORMAL LOW (ref 12.0–15.0)
Immature Granulocytes: 0 %
Lymphocytes Relative: 9 %
Lymphs Abs: 0.7 10*3/uL (ref 0.7–4.0)
MCH: 28.7 pg (ref 26.0–34.0)
MCHC: 32.2 g/dL (ref 30.0–36.0)
MCV: 89.2 fL (ref 80.0–100.0)
Monocytes Absolute: 0.4 10*3/uL (ref 0.1–1.0)
Monocytes Relative: 5 %
Neutro Abs: 6.8 10*3/uL (ref 1.7–7.7)
Neutrophils Relative %: 86 %
Platelets: 230 10*3/uL (ref 150–400)
RBC: 3.97 MIL/uL (ref 3.87–5.11)
RDW: 17.6 % — ABNORMAL HIGH (ref 11.5–15.5)
WBC: 7.9 10*3/uL (ref 4.0–10.5)
nRBC: 0 % (ref 0.0–0.2)

## 2020-09-28 LAB — LACTIC ACID, PLASMA: Lactic Acid, Venous: 1.3 mmol/L (ref 0.5–1.9)

## 2020-09-28 MED ORDER — ACETAMINOPHEN 325 MG PO TABS
650.0000 mg | ORAL_TABLET | Freq: Once | ORAL | Status: AC | PRN
  Administered 2020-09-28: 650 mg via ORAL
  Filled 2020-09-28: qty 2

## 2020-09-28 MED ORDER — ACETAMINOPHEN 325 MG PO TABS
325.0000 mg | ORAL_TABLET | Freq: Once | ORAL | Status: AC
  Administered 2020-09-28: 325 mg via ORAL
  Filled 2020-09-28: qty 1

## 2020-09-28 MED ORDER — LACTATED RINGERS IV BOLUS
500.0000 mL | Freq: Once | INTRAVENOUS | Status: AC
  Administered 2020-09-28: 500 mL via INTRAVENOUS

## 2020-09-28 NOTE — ED Triage Notes (Addendum)
Per EMS, patient from home, recent business trip with sick contacts. C/o cough and fever x4 days. Hypoxic with EMS. 83% on RA. Refused Tylenol with EMS. Patient is not vaccinated.

## 2020-09-28 NOTE — ED Provider Notes (Signed)
[facility]-EMERGENCY DEPT Provider Note   CSN: [id number] Arrival date & time: 09/28/20  2039     History Chief Complaint  Patient presents with  . Cough    Ashley Adams is a 49 y.o. female with a hx of hypertension, obesity, & sleep apnea who presents to the ED via EMS for evaluation of cough for the past 4 days.  Patient states she has has felt generally poorly with malaise & fatigue with associated subjective fevers, nasal congestion, dry cough, decreased appetite,& nausea. At times feels somewhat short of breath. No alleviating/aggravating factors.  She was recently out of town in [place] on a business trip, one of her coworkers tested positive for COVID-19.  She has not been tested yet.  She has not received a Covid vaccine.  Denies chest pain, syncope, vomiting, melena, hematochezia, abdominal pain, leg pain/swelling, hemoptysis, recent surgery/trauma, recent long travel, hormone use, personal hx of cancer, or hx of DVT/PE.  She is currently menstruating.   HPI     Past Medical History:  Diagnosis Date  . Hypertension   . Sleep apnea     Patient Active Problem List   Diagnosis Date Noted  . Generalized abdominal pain 02/01/2018  . Constipation 02/01/2018  . MORBID OBESITY 12/07/2007  . GASTROESOPHAGEAL REFLUX, NO ESOPHAGITIS 12/29/2006    History reviewed. No pertinent surgical history.   OB History    Gravida  2   Para  2   Term  2   Preterm  0   AB  0   Living  2     SAB  0   TAB  0   Ectopic  0   Multiple  0   Live Births              Family History  Problem Relation Age of Onset  . Hypertension Father   . Hypertension Other     Social History   Tobacco Use  . Smoking status: Never Smoker  . Smokeless tobacco: Never Used  Substance Use Topics  . Alcohol use: No  . Drug use: No    Home Medications Prior to Admission medications   Medication Sig Start Date End Date Taking? Authorizing Provider   aspirin 81 MG chewable tablet Chew 81 mg by mouth daily.    [provider]  clindamycin (CLEOCIN) 150 MG capsule Take 2 capsules (300 mg total) by mouth every 6 (six) hours. Patient not taking: Reported on 02/01/2018 06/11/13   Arthor Captain, PA-C  ibuprofen (ADVIL,MOTRIN) 200 MG tablet Take 800 mg by mouth every 8 (eight) hours as needed for pain.    [provider]  oxyCODONE-acetaminophen (PERCOCET) 10-325 MG per tablet Take 1 tablet by mouth every 4 (four) hours as needed for pain. Patient not taking: Reported on 02/01/2018 06/11/13   Arthor Captain, PA-C  Vitamin D, Ergocalciferol, (DRISDOL) 50000 units CAPS capsule Take 50,000 Units by mouth every other day.    [provider]    Allergies    Patient has no known allergies.  Review of Systems   Review of Systems  Constitutional: Positive for appetite change, fatigue and fever.  HENT: Positive for congestion. Negative for ear pain and sore throat.   Respiratory: Positive for cough and shortness of breath.   Cardiovascular: Negative for chest pain and leg swelling.  Gastrointestinal: Positive for nausea. Negative for abdominal pain, blood in stool and vomiting.  Genitourinary: Positive for vaginal bleeding (on menstrual cycle).  Neurological: Negative  for syncope.  All other systems reviewed and are negative.   Physical Exam Updated Vital Signs BP 119/67 (BP Location: Left Arm)   Pulse (!) 108   Temp (!) 102 F (38.9 C) (Oral)   Resp 20   SpO2 (!) 87%   Physical Exam Vitals and nursing note reviewed.  Constitutional:      Appearance: She is well-developed. She is ill-appearing (mildly). She is not toxic-appearing.  HENT:     Head: Normocephalic and atraumatic.  Eyes:     General:        Right eye: No discharge.        Left eye: No discharge.     Conjunctiva/sclera: Conjunctivae normal.  Cardiovascular:     Rate and Rhythm: Regular rhythm. Tachycardia present.  Pulmonary:     Effort: No  respiratory distress.     Breath sounds: No wheezing, rhonchi or rales.     Comments: SPO2 87% on room air.  Applied 1 L via nasal cannula with improvement to 92%. Abdominal:     General: There is no distension.     Palpations: Abdomen is soft.     Tenderness: There is no abdominal tenderness. There is no guarding or rebound.  Musculoskeletal:     Cervical back: Neck supple.     Right lower leg: No edema.     Left lower leg: No edema.     Comments: No calf tenderness.  Skin:    General: Skin is warm and dry.     Findings: No rash.  Neurological:     Mental Status: She is alert.     Comments: Clear speech.   Psychiatric:        Behavior: Behavior normal.     ED Results / Procedures / Treatments   Labs (all labs ordered are listed, but only abnormal results are displayed) Labs Reviewed  RESP PANEL BY RT-PCR (FLU A&B, COVID) ARPGX2 - Abnormal; Notable for the following components:      Result Value   SARS Coronavirus 2 by RT PCR POSITIVE (*)    All other components within normal limits  CBC WITH DIFFERENTIAL/PLATELET - Abnormal; Notable for the following components:   Hemoglobin 11.4 (*)    HCT 35.4 (*)    RDW 17.6 (*)    All other components within normal limits  COMPREHENSIVE METABOLIC PANEL - Abnormal; Notable for the following components:   Potassium 3.2 (*)    Glucose, Bld 141 (*)    Calcium 8.4 (*)    Albumin 3.2 (*)    AST 89 (*)    ALT 46 (*)    All other components within normal limits  RESPIRATORY PANEL BY PCR  CULTURE, BLOOD (ROUTINE X 2)  CULTURE, BLOOD (ROUTINE X 2)  LACTIC ACID, PLASMA  LACTIC ACID, PLASMA  D-DIMER, QUANTITATIVE (NOT AT [facility])  PROCALCITONIN  LACTATE DEHYDROGENASE  FERRITIN  TRIGLYCERIDES  FIBRINOGEN  C-REACTIVE PROTEIN  I-STAT BETA HCG BLOOD, ED ([facility], [facility], [facility] ONLY)    EKG None EKG Interpretation  Date/Time: 09/28/20 @ 2107   Vent. rate 106 BPM QRS duration 79 ms QT/QTc 302/401 ms    Text Interpretation: Sinus tachycardia,  borderline repolarization abnormality, no STEMI     Radiology DG Chest Port 1 View  Result Date: 09/28/2020 CLINICAL DATA:  Cough and fever for several days EXAM: PORTABLE CHEST 1 VIEW COMPARISON:  12/08/2014 FINDINGS: Cardiac shadow is within normal limits. The lungs are hypoinflated with mild bibasilar atelectatic changes. No sizable effusion is  noted. No bony abnormality is seen. IMPRESSION: Mild bibasilar atelectatic changes right greater than left. No other focal abnormality is noted. Electronically Signed   By: Alcide Clever M.D.   On: 09/28/2020 21:47    Procedures .Critical Care Performed by: Cherly Anderson, PA-C Authorized by: Cherly Anderson, PA-C    CRITICAL CARE Performed by: Harvie Heck   Total critical care time: 45 minutes  Critical care time was exclusive of separately billable procedures and treating other patients.  Critical care was necessary to treat or prevent imminent or life-threatening deterioration.  Critical care was time spent personally by me on the following activities: development of treatment plan with patient and/or surrogate as well as nursing, discussions with consultants, evaluation of patient's response to treatment, examination of patient, obtaining history from patient or surrogate, ordering and performing treatments and interventions, ordering and review of laboratory studies, ordering and review of radiographic studies, pulse oximetry and re-evaluation of patient's condition. (including critical care time)  Medications Ordered in ED Medications  acetaminophen (TYLENOL) tablet 650 mg (650 mg Oral Given 09/28/20 2058)    ED Course  I have reviewed the triage vital signs and the nursing notes.  Pertinent labs & imaging results that were available during my care of the patient were reviewed by me and considered in my medical decision making (see chart for details).    Ashley Adams was evaluated in Emergency Department  on 09/29/2020 for the symptoms described in the history of present illness. He/she was evaluated in the context of the global COVID-19 pandemic, which necessitated consideration that the patient might be at risk for infection with the SARS-CoV-2 virus that causes COVID-19. Institutional protocols and algorithms that pertain to the evaluation of patients at risk for COVID-19 are in a state of rapid change based on information released by regulatory bodies including the CDC and federal and state organizations. These policies and algorithms were followed during the patient's care in the ED.  MDM Rules/Calculators/A&P                          Patient presents to the ED with complaints of cough and generally not feeling well over the past 4 days.  Recent COVID-19 exposure.  Patient is unvaccinated.  She is nontoxic, mildly ill appearing, notably febrile and tachycardic, also noted to be hypoxic requiring supplemental oxygen to maintain SPO2 above 90%.  Primary concerns for COVID-19, also considering alternative viral illnesses as well as pneumonia, pulmonary embolism, pneumothorax, new onset CHF.  Additional history obtained:  Additional history obtained from chart review and nursing note reviewed..  EKG: No STEMI Lab Tests:  I Ordered, reviewed, and interpreted labs, which included:  CBC: mild anemia improved from prior. No leukocytosis.  CMP: Mildly elevated LFTs. Mild hypokalemia- will orally replace.  Lactic acid; WNL  Imaging Studies ordered:  CXR ordered by triage, I independently visualized and interpreted imaging which showed Mild bibasilar atelectatic changes right greater than left. No other focal abnormality is noted.  ED Course:  I called lab due to COVID swab still pending/delay- this is positive.   01:00: Nursing staff has increased supplemental O2 to 2L via [place], patient SPO2 91% on this, resting comfortably- updated on results & plan of care. COVID positive, remdesevir & decadron  ordered as well as additional labs including inflammatory markers. Will discuss w/ hospitalist service for admission.   01:17: CONSULT: Discussed with hospitalist Dr. Loney Loh- accepts admission.   Portions  of this note were generated with Scientist, clinical (histocompatibility and immunogenetics)Dragon dictation software. Dictation errors may occur despite best attempts at proofreading.   Final Clinical Impression(s) / ED Diagnoses Final diagnoses:  Acute hypoxemic respiratory failure [facility](HCC)  COVID-19    Rx / DC Orders ED Discharge Orders    None       Cherly Andersonetrucelli, Jaecion Dempster R, PA-C 09/29/20 0227    Paula LibraMolpus, John, MD 09/29/20 [phone]

## 2020-09-29 ENCOUNTER — Encounter (HOSPITAL_COMMUNITY): Payer: Self-pay | Admitting: Internal Medicine

## 2020-09-29 ENCOUNTER — Inpatient Hospital Stay (HOSPITAL_COMMUNITY): Payer: BC Managed Care – PPO

## 2020-09-29 DIAGNOSIS — I10 Essential (primary) hypertension: Secondary | ICD-10-CM | POA: Diagnosis present

## 2020-09-29 DIAGNOSIS — Z8249 Family history of ischemic heart disease and other diseases of the circulatory system: Secondary | ICD-10-CM | POA: Diagnosis not present

## 2020-09-29 DIAGNOSIS — J1282 Pneumonia due to coronavirus disease 2019: Secondary | ICD-10-CM | POA: Diagnosis present

## 2020-09-29 DIAGNOSIS — J9601 Acute respiratory failure with hypoxia: Secondary | ICD-10-CM | POA: Diagnosis present

## 2020-09-29 DIAGNOSIS — Z6837 Body mass index (BMI) 37.0-37.9, adult: Secondary | ICD-10-CM | POA: Diagnosis not present

## 2020-09-29 DIAGNOSIS — U071 COVID-19: Secondary | ICD-10-CM | POA: Diagnosis present

## 2020-09-29 DIAGNOSIS — Z7982 Long term (current) use of aspirin: Secondary | ICD-10-CM | POA: Diagnosis not present

## 2020-09-29 DIAGNOSIS — R509 Fever, unspecified: Secondary | ICD-10-CM | POA: Diagnosis present

## 2020-09-29 DIAGNOSIS — D649 Anemia, unspecified: Secondary | ICD-10-CM | POA: Diagnosis present

## 2020-09-29 DIAGNOSIS — A419 Sepsis, unspecified organism: Secondary | ICD-10-CM

## 2020-09-29 DIAGNOSIS — E669 Obesity, unspecified: Secondary | ICD-10-CM | POA: Diagnosis present

## 2020-09-29 DIAGNOSIS — E876 Hypokalemia: Secondary | ICD-10-CM

## 2020-09-29 DIAGNOSIS — N393 Stress incontinence (female) (male): Secondary | ICD-10-CM | POA: Diagnosis present

## 2020-09-29 DIAGNOSIS — R7989 Other specified abnormal findings of blood chemistry: Secondary | ICD-10-CM | POA: Diagnosis present

## 2020-09-29 DIAGNOSIS — R7401 Elevation of levels of liver transaminase levels: Secondary | ICD-10-CM

## 2020-09-29 DIAGNOSIS — A4189 Other specified sepsis: Secondary | ICD-10-CM | POA: Diagnosis present

## 2020-09-29 DIAGNOSIS — G4733 Obstructive sleep apnea (adult) (pediatric): Secondary | ICD-10-CM | POA: Diagnosis present

## 2020-09-29 DIAGNOSIS — Z79899 Other long term (current) drug therapy: Secondary | ICD-10-CM | POA: Diagnosis not present

## 2020-09-29 LAB — D-DIMER, QUANTITATIVE: D-Dimer, Quant: 1.31 ug/mL-FEU — ABNORMAL HIGH (ref 0.00–0.50)

## 2020-09-29 LAB — BLOOD CULTURE ID PANEL (REFLEXED) - BCID2

## 2020-09-29 LAB — FERRITIN: Ferritin: 135 ng/mL (ref 11–307)

## 2020-09-29 LAB — RESP PANEL BY RT-PCR (FLU A&B, COVID) ARPGX2
Influenza A by PCR: NEGATIVE
Influenza B by PCR: NEGATIVE
SARS Coronavirus 2 by RT PCR: POSITIVE — AB

## 2020-09-29 LAB — MAGNESIUM: Magnesium: 2 mg/dL (ref 1.7–2.4)

## 2020-09-29 LAB — LACTIC ACID, PLASMA: Lactic Acid, Venous: 1.2 mmol/L (ref 0.5–1.9)

## 2020-09-29 LAB — FIBRINOGEN: Fibrinogen: 574 mg/dL — ABNORMAL HIGH (ref 210–475)

## 2020-09-29 LAB — TRIGLYCERIDES: Triglycerides: 75 mg/dL (ref <150)

## 2020-09-29 LAB — PROCALCITONIN: Procalcitonin: 0.26 ng/mL

## 2020-09-29 LAB — C-REACTIVE PROTEIN: CRP: 6.1 mg/dL — ABNORMAL HIGH (ref <1.0)

## 2020-09-29 LAB — HIV ANTIBODY (ROUTINE TESTING W REFLEX): HIV Screen 4th Generation wRfx: NONREACTIVE

## 2020-09-29 MED ORDER — DEXAMETHASONE SODIUM PHOSPHATE 10 MG/ML IJ SOLN
6.0000 mg | Freq: Once | INTRAMUSCULAR | Status: AC
  Administered 2020-09-29: 6 mg via INTRAVENOUS
  Filled 2020-09-29: qty 1

## 2020-09-29 MED ORDER — SODIUM CHLORIDE (PF) 0.9 % IJ SOLN
INTRAMUSCULAR | Status: AC
  Filled 2020-09-29: qty 50

## 2020-09-29 MED ORDER — SODIUM CHLORIDE 0.9 % IV SOLN
2.0000 g | INTRAVENOUS | Status: DC
  Administered 2020-09-30 – 2020-10-01 (×2): 2 g via INTRAVENOUS
  Filled 2020-09-29 (×2): qty 2

## 2020-09-29 MED ORDER — GUAIFENESIN-DM 100-10 MG/5ML PO SYRP: 10.0000 mL | ORAL_SOLUTION | ORAL | Status: DC | PRN

## 2020-09-29 MED ORDER — SODIUM CHLORIDE 0.9 % IV SOLN
200.0000 mg | Freq: Once | INTRAVENOUS | Status: AC
  Administered 2020-09-29: 200 mg via INTRAVENOUS
  Filled 2020-09-29: qty 200

## 2020-09-29 MED ORDER — METHYLPREDNISOLONE SODIUM SUCC 125 MG IJ SOLR
55.0000 mg | Freq: Two times a day (BID) | INTRAMUSCULAR | Status: DC
  Administered 2020-09-29 – 2020-09-30 (×4): 55 mg via INTRAVENOUS
  Filled 2020-09-29 (×4): qty 2

## 2020-09-29 MED ORDER — IOHEXOL 350 MG/ML SOLN
100.0000 mL | Freq: Once | INTRAVENOUS | Status: AC | PRN
  Administered 2020-09-29: 100 mL via INTRAVENOUS

## 2020-09-29 MED ORDER — ASCORBIC ACID 500 MG PO TABS
500.0000 mg | ORAL_TABLET | Freq: Every day | ORAL | Status: DC
  Administered 2020-09-29 – 2020-10-07 (×9): 500 mg via ORAL
  Filled 2020-09-29 (×9): qty 1

## 2020-09-29 MED ORDER — POTASSIUM CHLORIDE CRYS ER 20 MEQ PO TBCR
40.0000 meq | EXTENDED_RELEASE_TABLET | Freq: Once | ORAL | Status: AC
  Administered 2020-09-29: 40 meq via ORAL
  Filled 2020-09-29: qty 2

## 2020-09-29 MED ORDER — HYDROCOD POLST-CPM POLST ER 10-8 MG/5ML PO SUER
5.0000 mL | Freq: Two times a day (BID) | ORAL | Status: DC | PRN
  Administered 2020-09-29 – 2020-10-07 (×13): 5 mL via ORAL
  Filled 2020-09-29 (×14): qty 5

## 2020-09-29 MED ORDER — ENOXAPARIN SODIUM 60 MG/0.6ML ~~LOC~~ SOLN
55.0000 mg | SUBCUTANEOUS | Status: DC
  Administered 2020-09-29 – 2020-10-06 (×8): 55 mg via SUBCUTANEOUS
  Filled 2020-09-29 (×9): qty 0.6

## 2020-09-29 MED ORDER — SODIUM CHLORIDE 0.9 % IV SOLN
500.0000 mg | INTRAVENOUS | Status: DC
  Administered 2020-09-30 – 2020-10-01 (×2): 500 mg via INTRAVENOUS
  Filled 2020-09-29 (×2): qty 500

## 2020-09-29 MED ORDER — ZINC SULFATE 220 (50 ZN) MG PO CAPS
220.0000 mg | ORAL_CAPSULE | Freq: Every day | ORAL | Status: DC
  Administered 2020-09-29 – 2020-10-07 (×9): 220 mg via ORAL
  Filled 2020-09-29 (×9): qty 1

## 2020-09-29 MED ORDER — ACETAMINOPHEN 325 MG PO TABS: 650.0000 mg | ORAL_TABLET | Freq: Four times a day (QID) | ORAL | Status: DC | PRN

## 2020-09-29 MED ORDER — PREDNISONE 50 MG PO TABS: 50.0000 mg | ORAL_TABLET | Freq: Every day | ORAL | Status: DC

## 2020-09-29 MED ORDER — BARICITINIB 2 MG PO TABS
4.0000 mg | ORAL_TABLET | Freq: Every day | ORAL | Status: DC
  Administered 2020-09-29 – 2020-10-07 (×9): 4 mg via ORAL
  Filled 2020-09-29 (×9): qty 2

## 2020-09-29 MED ORDER — ALBUTEROL SULFATE HFA 108 (90 BASE) MCG/ACT IN AERS
2.0000 | INHALATION_SPRAY | RESPIRATORY_TRACT | Status: DC | PRN
  Administered 2020-10-02 – 2020-10-06 (×2): 2 via RESPIRATORY_TRACT
  Filled 2020-09-29: qty 6.7

## 2020-09-29 MED ORDER — VITAMIN D 25 MCG (1000 UNIT) PO TABS
1000.0000 [IU] | ORAL_TABLET | Freq: Every day | ORAL | Status: DC
  Administered 2020-09-29 – 2020-10-07 (×9): 1000 [IU] via ORAL
  Filled 2020-09-29 (×9): qty 1

## 2020-09-29 MED ORDER — SODIUM CHLORIDE 0.9 % IV SOLN
100.0000 mg | Freq: Every day | INTRAVENOUS | Status: AC
  Administered 2020-09-30 – 2020-10-03 (×4): 100 mg via INTRAVENOUS
  Filled 2020-09-29 (×4): qty 20

## 2020-09-29 NOTE — Progress Notes (Signed)
No charge note  49 year old female with HTN, OSA, came into the hospital with cough and fever.  She was hypoxic at 83% on room air requiring supplemental oxygen.  She was diagnosed with Covid, and chest imaging showed multifocal pneumonia.  She was started on remdesivir and steroids and admitted to the hospital.   Acute hypoxic respiratory failure due to COVID-19 viral pneumonia, sepsis due to viral pneumonia -Continue supplemental oxygen, she is on 4 L this morning but appears quite comfortable -Continue Solu-Medrol, remdesivir -Discussed about baricitinib in case her oxygen is getting worse but hold for now.  She wants to read more and think prior to making the decision  Mild transaminitis -Likely due to acute viral illness, monitor LFTs  Essential hypertension -Not on treatment at home, monitor  Scheduled Meds: . vitamin C  500 mg Oral Daily  . cholecalciferol  1,000 Units Oral Daily  . enoxaparin (LOVENOX) injection  55 mg Subcutaneous Q24H  . methylPREDNISolone (SOLU-MEDROL) injection  55 mg Intravenous Q12H   Followed by  . [START ON 10/02/2020] predniSONE  50 mg Oral Daily  . potassium chloride  40 mEq Oral Once  . sodium chloride (PF)      . zinc sulfate  220 mg Oral Daily   Continuous Infusions: . [START ON 09/30/2020] remdesivir 100 mg in NS 100 mL     PRN Meds:.acetaminophen, albuterol, chlorpheniramine-HYDROcodone, guaiFENesin-dextromethorphan  Melek Pownall M. Elvera Lennox, MD, PhD [facility]  Between 7 am - 7 pm you can contact me via Amion or Securechat.  I am not available 7 pm - 7 am, please contact night coverage MD/APP via Amion

## 2020-09-29 NOTE — Progress Notes (Signed)
PHARMACY - PHYSICIAN COMMUNICATION CRITICAL VALUE ALERT - BLOOD CULTURE IDENTIFICATION (BCID)  Ashley Adams is an 49 y.o. female who presented to [facility] on 09/28/2020 with a chief complaint of 49 year old female with HTN, OSA, came into the hospital with cough and fever.  She was hypoxic at 83% on room air requiring supplemental oxygen.  She was diagnosed with Covid, and chest imaging showed multifocal pneumonia   Name of physician (or Provider) Contacted: B Morrison  Current antibiotics: none  Changes to prescribed antibiotics recommended:  None, probable contaminant  Results for orders placed or performed during the hospital encounter of 09/28/20  Blood Culture ID Panel (Reflexed) (Collected: 09/28/2020 11:07 PM)  Result Value Ref Range   Enterococcus faecalis NOT DETECTED NOT DETECTED   Enterococcus Faecium NOT DETECTED NOT DETECTED   Listeria monocytogenes NOT DETECTED NOT DETECTED   Staphylococcus species DETECTED (A) NOT DETECTED   Staphylococcus aureus (BCID) NOT DETECTED NOT DETECTED   Staphylococcus epidermidis NOT DETECTED NOT DETECTED   Staphylococcus lugdunensis NOT DETECTED NOT DETECTED   Streptococcus species NOT DETECTED NOT DETECTED   Streptococcus agalactiae NOT DETECTED NOT DETECTED   Streptococcus pneumoniae NOT DETECTED NOT DETECTED   Streptococcus pyogenes NOT DETECTED NOT DETECTED   A.calcoaceticus-baumannii NOT DETECTED NOT DETECTED   Bacteroides fragilis NOT DETECTED NOT DETECTED   Enterobacterales NOT DETECTED NOT DETECTED   Enterobacter cloacae complex NOT DETECTED NOT DETECTED   Escherichia coli NOT DETECTED NOT DETECTED   Klebsiella aerogenes NOT DETECTED NOT DETECTED   Klebsiella oxytoca NOT DETECTED NOT DETECTED   Klebsiella pneumoniae NOT DETECTED NOT DETECTED   Proteus species NOT DETECTED NOT DETECTED   Salmonella species NOT DETECTED NOT DETECTED   Serratia marcescens NOT DETECTED NOT DETECTED   Haemophilus influenzae NOT DETECTED NOT  DETECTED   Neisseria meningitidis NOT DETECTED NOT DETECTED   Pseudomonas aeruginosa NOT DETECTED NOT DETECTED   Stenotrophomonas maltophilia NOT DETECTED NOT DETECTED   Candida albicans NOT DETECTED NOT DETECTED   Candida auris NOT DETECTED NOT DETECTED   Candida glabrata NOT DETECTED NOT DETECTED   Candida krusei NOT DETECTED NOT DETECTED   Candida parapsilosis NOT DETECTED NOT DETECTED   Candida tropicalis NOT DETECTED NOT DETECTED   Cryptococcus neoformans/gattii NOT DETECTED NOT DETECTED    Arley Phenix RPh 09/29/2020, 10:52 PM

## 2020-09-29 NOTE — H&P (Signed)
History and Physical    Ashley Adams POT:[id number] DOB: 03-Oct-1971 DOA: 09/28/2020  PCP: Maryagnes Amos, FNP Patient coming from: Home  Chief Complaint: Cough, fever  HPI: Ashley Adams is a 49 y.o. female with medical history significant of hypertension, sleep apnea presenting to the ED via EMS with complaints of cough and fever.  SPO2 83% on room air with EMS.  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  She has been feeling ill for the past 1 week.  Symptoms include fevers, severe cough, congestion, and shortness of breath.  No nausea, vomiting, abdominal pain, or diarrhea.  She is not vaccinated against Covid.  ED Course: Febrile with temperature 103 F.  Tachycardic on arrival.  Slightly tachypneic.  Requiring 2 L supplemental oxygen.  SARS-CoV-2 PCR test positive.  Influenza panel negative.  WBC 7.9, hemoglobin 11.4 and hematocrit 35.4 (at baseline), platelet 230K.  Sodium 139, potassium 3.2, chloride 99, bicarb 27, BUN 13, creatinine 0.7, glucose 141.  Transaminases mildly elevated-AST 89, ALT 46.  Alk phos and T bili normal.  Lactic acid within normal range.  Blood culture x2 pending.  Procalcitonin level pending.  Inflammatory markers including D-dimer, ferritin, fibrinogen, triglycerides, and CRP pending.  Chest x-ray per radiologist interpretation showing mild bibasilar atelectatic changes, right greater than left.  Patient was given Tylenol, IV Decadron 6 mg, oral potassium 40 mEq, remdesivir, and a 500 cc LR bolus.  Review of Systems:  All systems reviewed and apart from history of presenting illness, are negative.  Past Medical History:  Diagnosis Date  . Hypertension   . Sleep apnea     History reviewed. No pertinent surgical history.   reports that she has never smoked. She has never used smokeless tobacco. She reports that she does not drink alcohol and does  not use drugs.  No Known Allergies  Family History  Problem Relation Age of Onset  . Hypertension Father   . Hypertension Other     Prior to Admission medications   Medication Sig Start Date End Date Taking? Authorizing Provider  aspirin 81 MG chewable tablet Chew 81 mg by mouth daily.   Yes [provider]  Dextromethorphan-guaiFENesin (MUCINEX DM PO) Take 1 tablet by mouth daily.   Yes [provider]  ibuprofen (ADVIL,MOTRIN) 200 MG tablet Take 800 mg by mouth every 8 (eight) hours as needed for pain.   Yes [provider]  Vitamin D, Ergocalciferol, (DRISDOL) 50000 units CAPS capsule Take 50,000 Units by mouth daily.    Yes [provider]  clindamycin (CLEOCIN) 150 MG capsule Take 2 capsules (300 mg total) by mouth every 6 (six) hours. Patient not taking: Reported on 02/01/2018 06/11/13   Arthor Captain, PA-C  oxyCODONE-acetaminophen (PERCOCET) 10-325 MG per tablet Take 1 tablet by mouth every 4 (four) hours as needed for pain. Patient not taking: Reported on 02/01/2018 06/11/13   Arthor Captain, PA-C    Physical Exam: Vitals:   09/29/20 0045 09/29/20 0115 09/29/20 0130 09/29/20 0145  BP: 119/72 120/70 120/72 119/72  Pulse: 83 67 86 85  Resp: (!) 25 (!) 27 (!) 22 (!) 22  Temp:      TempSrc:      SpO2: 94% 94% 92% 94%    Physical Exam Constitutional:      Appearance: She is ill-appearing.  HENT:     Head: Normocephalic and atraumatic.  Eyes:  Extraocular Movements: Extraocular movements intact.     Conjunctiva/sclera: Conjunctivae normal.  Cardiovascular:     Rate and Rhythm: Normal rate and regular rhythm.     Pulses: Normal pulses.  Pulmonary:     Effort: Pulmonary effort is normal.     Breath sounds: Rales present. No wheezing.     Comments: Respiratory rate 20-22 Bibasilar rales Sats in the mid 90s on 2 L supplemental oxygen Abdominal:     General: Bowel sounds are normal. There is no distension.     Palpations: Abdomen is  soft.     Tenderness: There is no abdominal tenderness.  Musculoskeletal:        General: No swelling or tenderness.     Cervical back: Normal range of motion and neck supple.  Skin:    General: Skin is warm and dry.  Neurological:     General: No focal deficit present.     Mental Status: She is alert and oriented to person, place, and time.     Labs on Admission: I have personally reviewed following labs and imaging studies  CBC: Recent Labs  Lab 09/28/20 2307  WBC 7.9  NEUTROABS 6.8  HGB 11.4*  HCT 35.4*  MCV 89.2  PLT 119   Basic Metabolic Panel: Recent Labs  Lab 09/28/20 2307  NA 139  K 3.2*  CL 99  CO2 27  GLUCOSE 141*  BUN 13  CREATININE 0.70  CALCIUM 8.4*   GFR: CrCl cannot be calculated (Unknown ideal weight.). Liver Function Tests: Recent Labs  Lab 09/28/20 2307  AST 89*  ALT 46*  ALKPHOS 49  BILITOT 0.5  PROT 6.9  ALBUMIN 3.2*   No results for input(s): LIPASE, AMYLASE in the last 168 hours. No results for input(s): AMMONIA in the last 168 hours. Coagulation Profile: No results for input(s): INR, PROTIME in the last 168 hours. Cardiac Enzymes: No results for input(s): CKTOTAL, CKMB, CKMBINDEX, TROPONINI in the last 168 hours. BNP (last 3 results) No results for input(s): PROBNP in the last 8760 hours. HbA1C: No results for input(s): HGBA1C in the last 72 hours. CBG: No results for input(s): GLUCAP in the last 168 hours. Lipid Profile: No results for input(s): CHOL, HDL, LDLCALC, TRIG, CHOLHDL, LDLDIRECT in the last 72 hours. Thyroid Function Tests: No results for input(s): TSH, T4TOTAL, FREET4, T3FREE, THYROIDAB in the last 72 hours. Anemia Panel: No results for input(s): VITAMINB12, FOLATE, FERRITIN, TIBC, IRON, RETICCTPCT in the last 72 hours. Urine analysis:    Component Value Date/Time   COLORURINE YELLOW 05/09/2012 0725   APPEARANCEUR HAZY (A) 05/09/2012 0725   LABSPEC 1.020 05/09/2012 0725   PHURINE 6.0 05/09/2012 0725    GLUCOSEU NEGATIVE 05/09/2012 0725   HGBUR NEGATIVE 05/09/2012 0725   HGBUR negative 06/27/2009 1006   BILIRUBINUR large (A) 02/01/2018 1552   KETONESUR small (15) (A) 02/01/2018 1552   KETONESUR NEGATIVE 05/09/2012 0725   PROTEINUR >=300 (A) 02/01/2018 1552   PROTEINUR NEGATIVE 05/09/2012 0725   UROBILINOGEN 2.0 (A) 02/01/2018 1552   UROBILINOGEN 0.2 05/09/2012 0725   NITRITE Positive (A) 02/01/2018 1552   NITRITE NEGATIVE 05/09/2012 0725   LEUKOCYTESUR Negative 02/01/2018 1552    Radiological Exams on Admission: DG Chest Port 1 View  Result Date: 09/28/2020 CLINICAL DATA:  Cough and fever for several days EXAM: PORTABLE CHEST 1 VIEW COMPARISON:  12/08/2014 FINDINGS: Cardiac shadow is within normal limits. The lungs are hypoinflated with mild bibasilar atelectatic changes. No sizable effusion is noted. No bony abnormality is  seen. IMPRESSION: Mild bibasilar atelectatic changes right greater than left. No other focal abnormality is noted. Electronically Signed   By: Inez Catalina M.D.   On: 09/28/2020 21:47    EKG: Independently reviewed.  Sinus tachycardia, rate increased since prior tracing.  Assessment/Plan Principal Problem:   Pneumonia due to COVID-19 virus Active Problems:   Acute hypoxemic respiratory failure (HCC)   Sepsis (HCC)   Hypokalemia   Transaminitis   Suspected COVID-19 viral pneumonia Acute hypoxemic respiratory failure Sepsis Patient is not vaccinated against Covid.  Meets sepsis criteria - 3 SIRS (fever, tachycardia, tachypnea) and positive for COVID-19 viral infection.  No lactic acidosis or hypotension to suggest severe sepsis.  Chest x-ray per radiologist interpretation showing mild bibasilar atelectatic changes, right greater than left.  I have personally reviewed the chest x-ray and feel that there are opacities at the bases concerning for Covid pneumonia.  SPO2 83% on room air, currently requiring 2 L oxygen to maintain sats above 92%. -CTA chest ordered  for further evaluation -Remdesivir -IV Solu-Medrol 0.5 mg/kg every 12 hours -Vitamin C, zinc, vitamin D -Antitussives as needed -Tylenol as needed -Bronchodilator as needed -Pending inflammatory markers including ferritin, fibrinogen, D-dimer, CRP, LDH -Pending procalcitonin level -Daily CBC with differential, CMP, CRP, D-dimer -Airborne and contact precautions -Incentive spirometry, flutter valve -Encourage prone positioning -Continuous pulse ox -Supplemental oxygen as needed to keep oxygen saturation above 90% -Blood culture x2 pending  Mild hypokalemia Likely due to poor oral intake in the setting of acute viral illness. -Received potassium supplementation in the ED.  Monitor potassium and magnesium levels.  Mild transaminitis Likely due to acute viral illness. -Continue to monitor LFTs  Hypertension Normotensive.  Not on antihypertensives at home. -Continue to monitor blood pressure  DVT prophylaxis: Lovenox Code Status: Full code Family Communication: No family available at this time. Disposition Plan: Status is: Inpatient  Remains inpatient appropriate because:IV treatments appropriate due to intensity of illness or inability to take PO, Inpatient level of care appropriate due to severity of illness and New supplemental oxygen requirement   Dispo: The patient is from: Home              Anticipated d/c is to: Home              Anticipated d/c date is: 3 days              Patient currently is not medically stable to d/c.  The medical decision making on this patient was of high complexity and the patient is at high risk for clinical deterioration, therefore this is a level 3 visit.  Shela Leff MD [facility]  If 7PM-7AM, please contact night-coverage [email or web address]  09/29/2020, 1:56 AM

## 2020-09-29 NOTE — Progress Notes (Signed)
[employer] Notified on staph species in 1/4 blood cultures isolated, likely contaminated Currently on remdesivir for covid,  Xray on admission does not appreciate a pneumonia process but patient symptomatic with shortness of breath and cough. Procalcitonin significantly elevated.  Started empirically on rocephin and azithromycin to cover bacteria as well.

## 2020-09-29 NOTE — Plan of Care (Signed)

## 2020-09-30 DIAGNOSIS — J9601 Acute respiratory failure with hypoxia: Secondary | ICD-10-CM

## 2020-09-30 DIAGNOSIS — R7401 Elevation of levels of liver transaminase levels: Secondary | ICD-10-CM

## 2020-09-30 LAB — RESPIRATORY PANEL BY PCR

## 2020-09-30 LAB — CBC WITH DIFFERENTIAL/PLATELET
Abs Immature Granulocytes: 0.02 10*3/uL (ref 0.00–0.07)
Basophils Absolute: 0 10*3/uL (ref 0.0–0.1)
Basophils Relative: 0 %
Eosinophils Absolute: 0 10*3/uL (ref 0.0–0.5)
Eosinophils Relative: 0 %
HCT: 33.8 % — ABNORMAL LOW (ref 36.0–46.0)
Hemoglobin: 10.9 g/dL — ABNORMAL LOW (ref 12.0–15.0)
Immature Granulocytes: 0 %
Lymphocytes Relative: 18 %
Lymphs Abs: 0.8 10*3/uL (ref 0.7–4.0)
MCH: 28.8 pg (ref 26.0–34.0)
MCHC: 32.2 g/dL (ref 30.0–36.0)
MCV: 89.4 fL (ref 80.0–100.0)
Monocytes Absolute: 0.3 10*3/uL (ref 0.1–1.0)
Monocytes Relative: 6 %
Neutro Abs: 3.6 10*3/uL (ref 1.7–7.7)
Neutrophils Relative %: 76 %
Platelets: 248 10*3/uL (ref 150–400)
RBC: 3.78 MIL/uL — ABNORMAL LOW (ref 3.87–5.11)
RDW: 18 % — ABNORMAL HIGH (ref 11.5–15.5)
WBC: 4.7 10*3/uL (ref 4.0–10.5)
nRBC: 0 % (ref 0.0–0.2)

## 2020-09-30 LAB — COMPREHENSIVE METABOLIC PANEL
ALT: 50 U/L — ABNORMAL HIGH (ref 0–44)
AST: 75 U/L — ABNORMAL HIGH (ref 15–41)
Albumin: 2.8 g/dL — ABNORMAL LOW (ref 3.5–5.0)
Alkaline Phosphatase: 41 U/L (ref 38–126)
Anion gap: 9 (ref 5–15)
BUN: 21 mg/dL — ABNORMAL HIGH (ref 6–20)
CO2: 22 mmol/L (ref 22–32)
Calcium: 8.3 mg/dL — ABNORMAL LOW (ref 8.9–10.3)
Chloride: 108 mmol/L (ref 98–111)
Creatinine, Ser: 0.63 mg/dL (ref 0.44–1.00)
GFR, Estimated: >60 mL/min (ref >60)
Glucose, Bld: 135 mg/dL — ABNORMAL HIGH (ref 70–99)
Potassium: 3.7 mmol/L (ref 3.5–5.1)
Sodium: 139 mmol/L (ref 135–145)
Total Bilirubin: 0.3 mg/dL (ref 0.3–1.2)
Total Protein: 6.3 g/dL — ABNORMAL LOW (ref 6.5–8.1)

## 2020-09-30 LAB — D-DIMER, QUANTITATIVE: D-Dimer, Quant: 0.96 ug/mL-FEU — ABNORMAL HIGH (ref 0.00–0.50)

## 2020-09-30 LAB — PROCALCITONIN: Procalcitonin: <0.1 ng/mL

## 2020-09-30 LAB — C-REACTIVE PROTEIN: CRP: 3.3 mg/dL — ABNORMAL HIGH (ref <1.0)

## 2020-09-30 MED ORDER — VITAMIN B-12 100 MCG PO TABS
100.0000 ug | ORAL_TABLET | Freq: Every day | ORAL | Status: DC
  Administered 2020-09-30 – 2020-10-07 (×8): 100 ug via ORAL
  Filled 2020-09-30 (×8): qty 1

## 2020-09-30 NOTE — Plan of Care (Signed)

## 2020-09-30 NOTE — Progress Notes (Signed)
PROGRESS NOTE  Ashley Adams AOZ:[id number] DOB: 03-14-1971 DOA: 09/28/2020 PCP: Maryagnes Amos, FNP   LOS: 1 day   Brief Narrative / Interim history: 49 year old female with HTN, OSA, came into the hospital with cough and fever.  She was hypoxic at 83% on room air requiring supplemental oxygen.  She was diagnosed with Covid, and chest imaging showed multifocal pneumonia.  She was started on remdesivir and steroids and admitted to the hospital.  Micro overnight 11/29-11/13 growing GPC's in 1/4 bottles, likely contaminant but started on empiric antibiotics by night coverage NP  Subjective / 24h Interval events: She feels much better this morning, happy that she received antibiotics overnight.  Appreciates that her breathing is much better  Assessment & Plan:  Principal Problem Acute Hypoxic Respiratory Failure due to Covid-19 Viral Illness -Patient started on remdesivir and steroids, continue.  Yesterday her hypoxia was briefly worse when going from 4 L to 6 L, and patient was consented for baricitinib.  Continue all 3 - Micro overnight 11/29-11/13 growing GPC's in 1/4 bottles, likely contaminant but started on empiric antibiotics by night coverage NP.  We will recheck a procalcitonin and wait on final speciation, can probably discontinue antibiotics soon   COVID-19 Labs  Recent Labs    09/28/20 2307 09/30/20 0545  DDIMER 1.31* 0.96*  FERRITIN 135  --   CRP 6.1* 3.3*    Lab Results  Component Value Date   SARSCOV2NAA POSITIVE (A) 09/28/2020   Baricitinib is being used under EUA by the FDA. The patient has no ESRD or AKI, known history of TB, severe neutropenia (ANC <500) or lymphopenia (ALC <200), or severe LFT elevations. The option to use/refuse baricitinib treatment under FDA authorization (not approval), the significant known and potential risks and benefits, the extent to which these are unknown, and information regarding all available alternatives were discussed in  detail. Specifically the risk of VTE and secondary infections were discussed in detail with the patient and/or HCPOA. They consent to proceed with treatment  Active Problems Mild LFT elevation -Likely due to Covid viral illness.  Stable  Normocytic anemia -Chronic per patient, no bleeding, monitor  Scheduled Meds: . vitamin C  500 mg Oral Daily  . baricitinib  4 mg Oral Daily  . cholecalciferol  1,000 Units Oral Daily  . enoxaparin (LOVENOX) injection  55 mg Subcutaneous Q24H  . methylPREDNISolone (SOLU-MEDROL) injection  55 mg Intravenous Q12H   Followed by  . [START ON 10/02/2020] predniSONE  50 mg Oral Daily  . vitamin B-12  100 mcg Oral Daily  . zinc sulfate  220 mg Oral Daily   Continuous Infusions: . azithromycin 500 mg (09/30/20 0109)  . cefTRIAXone (ROCEPHIN)  IV 2 g (09/30/20 0022)  . remdesivir 100 mg in NS 100 mL 100 mg (09/30/20 1019)   PRN Meds:.acetaminophen, albuterol, chlorpheniramine-HYDROcodone, guaiFENesin-dextromethorphan  DVT prophylaxis: Lovenox Code Status: Full code Family Communication: Discussed with Sister Selena Batten over the phone   Status is: Inpatient  Remains inpatient appropriate because:Inpatient level of care appropriate due to severity of illness   Dispo: The patient is from: Home              Anticipated d/c is to: Home              Anticipated d/c date is: 3 days              Patient currently is not medically stable to d/c.  Consultants:  None   Procedures:  None  Microbiology: 1/4 blood cultures-GPC's  Antibacterials: Ceftriaxone / Azithro 11/30   Objective: Vitals:   09/29/20 1215 09/29/20 1542 09/29/20 2104 09/30/20 0533  BP: 107/70 108/70 107/79 109/75  Pulse: 78 72 74 75  Resp: Temp: 99.8 F (37.7 C) 98.4 F (36.9 C) 99.4 F (37.4 C) 99.2 F (37.3 C)  TempSrc: Oral  Oral Oral  SpO2: 94% 94% 96% (!) 89%  Weight:      Height:        Intake/Output Summary (Last 24 hours) at 09/30/2020 1029 Last data  filed at 09/30/2020 0210 Gross per 24 hour  Intake 354.17 ml  Output --  Net 354.17 ml   Filed Weights   09/29/20 0230  Weight: 109.1 kg    Examination:  Constitutional: NAD Eyes: no scleral icterus ENMT: Mucous membranes are moist.  Neck: normal, supple Respiratory: Bibasilar rhonchi, no wheezing, no crackles.  Tachypneic at times Cardiovascular: Regular rate and rhythm, no murmurs / rubs / gallops. No LE edema.  Abdomen: non distended, no tenderness. Bowel sounds positive.  Musculoskeletal: no clubbing / cyanosis.  Skin: no rashes Neurologic: non focal   Data Reviewed: I have independently reviewed following labs and imaging studies   CBC: Recent Labs  Lab 09/28/20 2307 09/30/20 0545  WBC 7.9 4.7  NEUTROABS 6.8 3.6  HGB 11.4* 10.9*  HCT 35.4* 33.8*  MCV 89.2 89.4  PLT 230 248   Basic Metabolic Panel: Recent Labs  Lab 09/28/20 2307 09/29/20 0439 09/30/20 0545  NA 139  --  139  K 3.2*  --  3.7  CL 99  --  108  CO2 27  --  22  GLUCOSE 141*  --  135*  BUN 13  --  21*  CREATININE 0.70  --  0.63  CALCIUM 8.4*  --  8.3*  MG  --  2.0  --    GFR: Estimated Creatinine Clearance: 107 mL/min (by C-G formula based on SCr of 0.63 mg/dL). Liver Function Tests: Recent Labs  Lab 09/28/20 2307 09/30/20 0545  AST 89* 75*  ALT 46* 50*  ALKPHOS 49 41  BILITOT 0.5 0.3  PROT 6.9 6.3*  ALBUMIN 3.2* 2.8*   No results for input(s): LIPASE, AMYLASE in the last 168 hours. No results for input(s): AMMONIA in the last 168 hours. Coagulation Profile: No results for input(s): INR, PROTIME in the last 168 hours. Cardiac Enzymes: No results for input(s): CKTOTAL, CKMB, CKMBINDEX, TROPONINI in the last 168 hours. BNP (last 3 results) No results for input(s): PROBNP in the last 8760 hours. HbA1C: No results for input(s): HGBA1C in the last 72 hours. CBG: No results for input(s): GLUCAP in the last 168 hours. Lipid Profile: Recent Labs    09/28/20 2307  TRIG 75    Thyroid Function Tests: No results for input(s): TSH, T4TOTAL, FREET4, T3FREE, THYROIDAB in the last 72 hours. Anemia Panel: Recent Labs    09/28/20 2307  FERRITIN 135   Urine analysis:    Component Value Date/Time   COLORURINE YELLOW 05/09/2012 0725   APPEARANCEUR HAZY (A) 05/09/2012 0725   LABSPEC 1.020 05/09/2012 0725   PHURINE 6.0 05/09/2012 0725   GLUCOSEU NEGATIVE 05/09/2012 0725   HGBUR NEGATIVE 05/09/2012 0725   HGBUR negative 06/27/2009 1006   BILIRUBINUR large (A) 02/01/2018 1552   KETONESUR small (15) (A) 02/01/2018 1552   KETONESUR NEGATIVE 05/09/2012 0725   PROTEINUR >=300 (A) 02/01/2018 1552   PROTEINUR NEGATIVE 05/09/2012 0725   UROBILINOGEN 2.0 (A)  02/01/2018 1552   UROBILINOGEN 0.2 05/09/2012 0725   NITRITE Positive (A) 02/01/2018 1552   NITRITE NEGATIVE 05/09/2012 0725   LEUKOCYTESUR Negative 02/01/2018 1552   Sepsis Labs: Invalid input(s): PROCALCITONIN, LACTICIDVEN  Recent Results (from the past 240 hour(s))  Resp Panel by RT-PCR (Flu A&B, Covid) Nasopharyngeal Swab     Status: Abnormal   Collection Time: 09/28/20 10:43 PM   Specimen: Nasopharyngeal Swab; Nasopharyngeal(NP) swabs in vial transport medium  Result Value Ref Range Status   SARS Coronavirus 2 by RT PCR POSITIVE (A) NEGATIVE Final    Comment: RESULT CALLED TO, READ BACK BY AND VERIFIED WITH: PETRUCELLI RN/NP @ 00:57 09/29/20 BY BOWMAN,K  (NOTE) SARS-CoV-2 target nucleic acids are DETECTED.  The SARS-CoV-2 RNA is generally detectable in upper respiratory specimens during the acute phase of infection. Positive results are indicative of the presence of the identified virus, but do not rule out bacterial infection or co-infection with other pathogens not detected by the test. Clinical correlation with patient history and other diagnostic information is necessary to determine patient infection status. The expected result is Negative.  Fact Sheet for  Patients: BloggerCourse.comhttps://www.fda.gov/media/152166/download  Fact Sheet for Healthcare Providers: SeriousBroker.ithttps://www.fda.gov/media/152162/download  This test is not yet approved or cleared by the [place] FDA and  has been authorized for detection and/or diagnosis of SARS-CoV-2 by FDA under an Emergency Use Authorization (EUA).  This EUA will remain in effect (meaning thi s test can be used) for the duration of  the COVID-19 declaration under Section 564(b)(1) of the Act, 21 U.S.C. section 360bbb-3(b)(1), unless the authorization is terminated or revoked sooner.     Influenza A by PCR NEGATIVE NEGATIVE Final   Influenza B by PCR NEGATIVE NEGATIVE Final    Comment: (NOTE) The Xpert Xpress SARS-CoV-2/FLU/RSV plus assay is intended as an aid in the diagnosis of influenza from Nasopharyngeal swab specimens and should not be used as a sole basis for treatment. Nasal washings and aspirates are unacceptable for Xpert Xpress SARS-CoV-2/FLU/RSV testing.  Fact Sheet for Patients: BloggerCourse.comhttps://www.fda.gov/media/152166/download  Fact Sheet for Healthcare Providers: SeriousBroker.ithttps://www.fda.gov/media/152162/download  This test is not yet approved or cleared by the [place] FDA and has been authorized for detection and/or diagnosis of SARS-CoV-2 by FDA under an Emergency Use Authorization (EUA). This EUA will remain in effect (meaning this test can be used) for the duration of the COVID-19 declaration under Section 564(b)(1) of the Act, 21 U.S.C. section 360bbb-3(b)(1), unless the authorization is terminated or revoked.  Performed at [facility], [address] [place], [place] [address]   Blood Culture (routine x 2)     Status: None (Preliminary result)   Collection Time: 09/28/20 11:07 PM   Specimen: BLOOD  Result Value Ref Range Status   Specimen Description   Final    BLOOD BLOOD RIGHT HAND Performed at [facility], [address] [place], [place]  [address]    Special Requests   Final    BOTTLES DRAWN AEROBIC AND ANAEROBIC Blood Culture results may not be optimal due to an excessive volume of blood received in culture bottles Performed at [facility], [address] [place], [place] [address]    Culture   Final    NO GROWTH 1 DAY Performed at [facility], [address] [place], [place] [address]    Report Status PENDING  Incomplete  Blood Culture (routine x 2)     Status: None (Preliminary result)   Collection Time: 09/28/20 11:07  PM   Specimen: BLOOD  Result Value Ref Range Status   Specimen Description   Final    BLOOD LEFT ANTECUBITAL Performed at [facility], [address], [place], [place] [address]    Special Requests   Final    BOTTLES DRAWN AEROBIC AND ANAEROBIC Blood Culture results may not be optimal due to an excessive volume of blood received in culture bottles Performed at [facility], [address] [place], [place] [address]    Culture  Setup Time   Final    GRAM POSITIVE COCCI IN CLUSTERS IN BOTH AEROBIC AND ANAEROBIC BOTTLES Organism ID to follow CRITICAL RESULT CALLED TO, READ BACK BY AND VERIFIED WITH: A [facility] PHARMD [id number] 09/29/20 A BROWNING Performed at [facility], [address], [place], [place] [address]    Culture GRAM POSITIVE COCCI  Final   Report Status PENDING  Incomplete  Blood Culture ID Panel (Reflexed)     Status: Abnormal   Collection Time: 09/28/20 11:07 PM  Result Value Ref Range Status   Enterococcus faecalis NOT DETECTED NOT DETECTED Final   Enterococcus Faecium NOT DETECTED NOT DETECTED Final   Listeria monocytogenes NOT DETECTED NOT DETECTED Final   Staphylococcus species DETECTED (A) NOT DETECTED Final    Comment: CRITICAL RESULT CALLED TO, READ BACK BY AND VERIFIED WITH: A [facility] PHARMD [id number] 09/29/20 A BROWNING    Staphylococcus aureus (BCID) NOT DETECTED NOT DETECTED Final   Staphylococcus epidermidis  NOT DETECTED NOT DETECTED Final   Staphylococcus lugdunensis NOT DETECTED NOT DETECTED Final   Streptococcus species NOT DETECTED NOT DETECTED Final   Streptococcus agalactiae NOT DETECTED NOT DETECTED Final   Streptococcus pneumoniae NOT DETECTED NOT DETECTED Final   Streptococcus pyogenes NOT DETECTED NOT DETECTED Final   A.calcoaceticus-baumannii NOT DETECTED NOT DETECTED Final   Bacteroides fragilis NOT DETECTED NOT DETECTED Final   Enterobacterales NOT DETECTED NOT DETECTED Final   Enterobacter cloacae complex NOT DETECTED NOT DETECTED Final   Escherichia coli NOT DETECTED NOT DETECTED Final   Klebsiella aerogenes NOT DETECTED NOT DETECTED Final   Klebsiella oxytoca NOT DETECTED NOT DETECTED Final   Klebsiella pneumoniae NOT DETECTED NOT DETECTED Final   Proteus species NOT DETECTED NOT DETECTED Final   Salmonella species NOT DETECTED NOT DETECTED Final   Serratia marcescens NOT DETECTED NOT DETECTED Final   Haemophilus influenzae NOT DETECTED NOT DETECTED Final   Neisseria meningitidis NOT DETECTED NOT DETECTED Final   Pseudomonas aeruginosa NOT DETECTED NOT DETECTED Final   Stenotrophomonas maltophilia NOT DETECTED NOT DETECTED Final   Candida albicans NOT DETECTED NOT DETECTED Final   Candida auris NOT DETECTED NOT DETECTED Final   Candida glabrata NOT DETECTED NOT DETECTED Final   Candida krusei NOT DETECTED NOT DETECTED Final   Candida parapsilosis NOT DETECTED NOT DETECTED Final   Candida tropicalis NOT DETECTED NOT DETECTED Final   Cryptococcus neoformans/gattii NOT DETECTED NOT DETECTED Final    Comment: Performed at [facility], [address], [place], [place] [address]      Radiology Studies: CT ANGIO CHEST PE W OR WO CONTRAST  Result Date: 09/29/2020 CLINICAL DATA:  Pulmonary embolism suspected.  COVID positive. EXAM: CT ANGIOGRAPHY CHEST WITH CONTRAST TECHNIQUE: Multidetector CT imaging of the chest was performed using the standard protocol during bolus  administration of intravenous contrast. Multiplanar CT image reconstructions and MIPs were obtained to evaluate the vascular anatomy. CONTRAST:  OMNIPAQUE IOHEXOL 350 MG/ML SOLN COMPARISON:  None. FINDINGS: Cardiovascular:  Satisfactory opacification of the pulmonary arteries to the segmental level. No evidence of pulmonary embolism when allowing for levels of motion and streak artifact. Normal heart size. No pericardial effusion. Mediastinum/Nodes: Negative for adenopathy or mass Lungs/Pleura: Patchy ground-glass opacity with subpleural predilection and normal intervening lung. No effusion or air leak Upper Abdomen: Unremarkable Musculoskeletal: Unremarkable. Review of the MIP images confirms the above findings. IMPRESSION: 1. COVID pattern pneumonia. 2. No evidence for pulmonary embolism. Electronically Signed   By: Marnee Spring M.D.   On: 09/29/2020 04:11   DG Chest Port 1 View  Result Date: 09/28/2020 CLINICAL DATA:  Cough and fever for several days EXAM: PORTABLE CHEST 1 VIEW COMPARISON:  12/08/2014 FINDINGS: Cardiac shadow is within normal limits. The lungs are hypoinflated with mild bibasilar atelectatic changes. No sizable effusion is noted. No bony abnormality is seen. IMPRESSION: Mild bibasilar atelectatic changes right greater than left. No other focal abnormality is noted. Electronically Signed   By: Alcide Clever M.D.   On: 09/28/2020 21:47    Pamella Pert, MD, PhD [facility]  Between 7 am - 7 pm I am available, please contact me via Amion or Securechat  Between 7 pm - 7 am I am not available, please contact night coverage MD/APP via Amion

## 2020-10-01 DIAGNOSIS — E876 Hypokalemia: Secondary | ICD-10-CM

## 2020-10-01 LAB — COMPREHENSIVE METABOLIC PANEL
ALT: 60 U/L — ABNORMAL HIGH (ref 0–44)
AST: 76 U/L — ABNORMAL HIGH (ref 15–41)
Albumin: 2.9 g/dL — ABNORMAL LOW (ref 3.5–5.0)
Alkaline Phosphatase: 45 U/L (ref 38–126)
Anion gap: 10 (ref 5–15)
BUN: 26 mg/dL — ABNORMAL HIGH (ref 6–20)
CO2: 24 mmol/L (ref 22–32)
Calcium: 8.5 mg/dL — ABNORMAL LOW (ref 8.9–10.3)
Chloride: 105 mmol/L (ref 98–111)
Creatinine, Ser: 0.73 mg/dL (ref 0.44–1.00)
GFR, Estimated: >60 mL/min (ref >60)
Glucose, Bld: 140 mg/dL — ABNORMAL HIGH (ref 70–99)
Potassium: 3.6 mmol/L (ref 3.5–5.1)
Sodium: 139 mmol/L (ref 135–145)
Total Bilirubin: 0.4 mg/dL (ref 0.3–1.2)
Total Protein: 6.7 g/dL (ref 6.5–8.1)

## 2020-10-01 LAB — CBC WITH DIFFERENTIAL/PLATELET
Abs Immature Granulocytes: 0.06 10*3/uL (ref 0.00–0.07)
Basophils Absolute: 0 10*3/uL (ref 0.0–0.1)
Basophils Relative: 0 %
Eosinophils Absolute: 0 10*3/uL (ref 0.0–0.5)
Eosinophils Relative: 0 %
HCT: 36.5 % (ref 36.0–46.0)
Hemoglobin: 11.2 g/dL — ABNORMAL LOW (ref 12.0–15.0)
Immature Granulocytes: 1 %
Lymphocytes Relative: 12 %
Lymphs Abs: 1.1 10*3/uL (ref 0.7–4.0)
MCH: 28.6 pg (ref 26.0–34.0)
MCHC: 30.7 g/dL (ref 30.0–36.0)
MCV: 93.1 fL (ref 80.0–100.0)
Monocytes Absolute: 0.5 10*3/uL (ref 0.1–1.0)
Monocytes Relative: 5 %
Neutro Abs: 7.7 10*3/uL (ref 1.7–7.7)
Neutrophils Relative %: 82 %
Platelets: 328 10*3/uL (ref 150–400)
RBC: 3.92 MIL/uL (ref 3.87–5.11)
RDW: 18.2 % — ABNORMAL HIGH (ref 11.5–15.5)
WBC: 9.4 10*3/uL (ref 4.0–10.5)
nRBC: 0 % (ref 0.0–0.2)

## 2020-10-01 LAB — CULTURE, BLOOD (ROUTINE X 2)

## 2020-10-01 LAB — D-DIMER, QUANTITATIVE: D-Dimer, Quant: 0.88 ug/mL-FEU — ABNORMAL HIGH (ref 0.00–0.50)

## 2020-10-01 LAB — C-REACTIVE PROTEIN: CRP: 1.8 mg/dL — ABNORMAL HIGH (ref <1.0)

## 2020-10-01 MED ORDER — METHYLPREDNISOLONE SODIUM SUCC 125 MG IJ SOLR
60.0000 mg | Freq: Two times a day (BID) | INTRAMUSCULAR | Status: DC
  Administered 2020-10-01 – 2020-10-04 (×7): 60 mg via INTRAVENOUS
  Filled 2020-10-01 (×7): qty 2

## 2020-10-01 NOTE — Progress Notes (Addendum)
PROGRESS NOTE  Ashley Adams  SJG:[id number] DOB: 03/04/1971 DOA: 09/28/2020 PCP: Maryagnes Amos, FNP   Brief Narrative: Ashley Adams is a 49 y.o. female with a history of HTN, OSA, obesity who presented 11/28 with cough and fever, found to be hypoxic to 83% on room air requiring supplemental oxygen. SARS-CoV-2 PCR was positive and CXR revealed multifocal opacities. Remdesivir and steroids were started. She continues to require supplemental oxygen.   Assessment & Plan: Principal Problem:   Pneumonia due to COVID-19 virus Active Problems:   Acute hypoxemic respiratory failure (HCC)   Sepsis (HCC)   Hypokalemia   Transaminitis  Acute hypoxemic respiratory failure due to covid-19 pneumonia: SARS-CoV-2 PCR positive on 11/28.  - Remains hypoxic at rest to 87% this morning on 4L O2. Wean as tolerated. - Continue remdesivir x5 days (11/29 - 12/3) - Continue steroids. CRP improving as expected. - Continue baricitinib (started 11/29 in response to advancing hypoxia) - Encourage OOB, IS, FV, and awake proning if able - Continue airborne, contact precautions for 21 days from positive testing. - Monitor CMP and inflammatory markers - Enoxaparin prophylactic dose.   Blood culture contaminant: Staph hominis in 1 of 2 collections. PCT is negative. - Monitor further off of antibiotics.   LFT elevation: Mild, due to covid-19 most likely. Not currently a contraindication to therapies as above. - Continue monitoring.   Normocytic anemia: Chronic, no bleeding.  - Monitor  Obesity: Estimated body mass index is 37.67 kg/m as calculated from the following:   Height as of this encounter: 5\' 7"  (1.702 m).   Weight as of this encounter: 109.1 kg.  DVT prophylaxis: Lovenox Code Status: Full Family Communication: Sister by phone this PM - Called without answer this afternoon. Disposition Plan:  Status is: Inpatient  Remains inpatient appropriate because:Inpatient level of care  appropriate due to severity of illness  Dispo: The patient is from: Home              Anticipated d/c is to: Home              Anticipated d/c date is: 2 days              Patient currently is not medically stable to d/c.  Consultants:   None  Procedures:   None  Antimicrobials:  Remdesivir 11/29 - 12/3  Ceftriaxone, azithromycin 11/30 - 12/1   Subjective: Shortness of breath, cough, fatigue all improved from admission, mild-moderate and constant. No chest pain. Oxygen saturations still drop when weaned from oxygen and at rest. Cannot tolerate CPAP except nasal-only option.  Objective: Vitals:   09/30/20 0533 09/30/20 1323 09/30/20 2056 10/01/20 0535  BP: 109/75 118/79 119/74 114/81  Pulse: 75 69 72 63  Resp: 20 (!) 25 20 20   Temp: 99.2 F (37.3 C) 98.3 F (36.8 C) 98.6 F (37 C) 97.9 F (36.6 C)  TempSrc: Oral Oral Oral Oral  SpO2: (!) 89% (!) 85% 90% 91%  Weight:      Height:        Intake/Output Summary (Last 24 hours) at 10/01/2020 1105 Last data filed at 09/30/2020 1800 Gross per 24 hour  Intake 460 ml  Output --  Net 460 ml   Filed Weights   09/29/20 0230  Weight: 109.1 kg    Gen: 49 y.o. female in no distress  Pulm: Non-labored tachypnea at rest breathing 4L O2, diminished.  CV: Regular rate and rhythm. No murmur, rub, or gallop. No JVD, no pedal edema.  GI: Abdomen soft, non-tender, non-distended, with normoactive bowel sounds. No organomegaly or masses felt. Ext: Warm, no deformities Skin: No rashes, lesions or ulcers Neuro: Alert and oriented. No focal neurological deficits. Psych: Judgement and insight appear normal. Mood & affect appropriate.   Data Reviewed: I have personally reviewed following labs and imaging studies  CBC: Recent Labs  Lab 09/28/20 2307 09/30/20 0545 10/01/20 0517  WBC 7.9 4.7 9.4  NEUTROABS 6.8 3.6 7.7  HGB 11.4* 10.9* 11.2*  HCT 35.4* 33.8* 36.5  MCV 89.2 89.4 93.1  PLT 230 248 328   Basic Metabolic  Panel: Recent Labs  Lab 09/28/20 2307 09/29/20 0439 09/30/20 0545 10/01/20 0517  NA 139  --  139 139  K 3.2*  --  3.7 3.6  CL 99  --  108 105  CO2 27  --  22 24  GLUCOSE 141*  --  135* 140*  BUN 13  --  21* 26*  CREATININE 0.70  --  0.63 0.73  CALCIUM 8.4*  --  8.3* 8.5*  MG  --  2.0  --   --    GFR: Estimated Creatinine Clearance: 107 mL/min (by C-G formula based on SCr of 0.73 mg/dL). Liver Function Tests: Recent Labs  Lab 09/28/20 2307 09/30/20 0545 10/01/20 0517  AST 89* 75* 76*  ALT 46* 50* 60*  ALKPHOS 49 41 45  BILITOT 0.5 0.3 0.4  PROT 6.9 6.3* 6.7  ALBUMIN 3.2* 2.8* 2.9*   No results for input(s): LIPASE, AMYLASE in the last 168 hours. No results for input(s): AMMONIA in the last 168 hours. Coagulation Profile: No results for input(s): INR, PROTIME in the last 168 hours. Cardiac Enzymes: No results for input(s): CKTOTAL, CKMB, CKMBINDEX, TROPONINI in the last 168 hours. BNP (last 3 results) No results for input(s): PROBNP in the last 8760 hours. HbA1C: No results for input(s): HGBA1C in the last 72 hours. CBG: No results for input(s): GLUCAP in the last 168 hours. Lipid Profile: Recent Labs    09/28/20 2307  TRIG 75   Thyroid Function Tests: No results for input(s): TSH, T4TOTAL, FREET4, T3FREE, THYROIDAB in the last 72 hours. Anemia Panel: Recent Labs    09/28/20 2307  FERRITIN 135   Urine analysis:    Component Value Date/Time   COLORURINE YELLOW 05/09/2012 0725   APPEARANCEUR HAZY (A) 05/09/2012 0725   LABSPEC 1.020 05/09/2012 0725   PHURINE 6.0 05/09/2012 0725   GLUCOSEU NEGATIVE 05/09/2012 0725   HGBUR NEGATIVE 05/09/2012 0725   HGBUR negative 06/27/2009 1006   BILIRUBINUR large (A) 02/01/2018 1552   KETONESUR small (15) (A) 02/01/2018 1552   KETONESUR NEGATIVE 05/09/2012 0725   PROTEINUR >=300 (A) 02/01/2018 1552   PROTEINUR NEGATIVE 05/09/2012 0725   UROBILINOGEN 2.0 (A) 02/01/2018 1552   UROBILINOGEN 0.2 05/09/2012 0725    NITRITE Positive (A) 02/01/2018 1552   NITRITE NEGATIVE 05/09/2012 0725   LEUKOCYTESUR Negative 02/01/2018 1552   Recent Results (from the past 240 hour(s))  Resp Panel by RT-PCR (Flu A&B, Covid) Nasopharyngeal Swab     Status: Abnormal   Collection Time: 09/28/20 10:43 PM   Specimen: Nasopharyngeal Swab; Nasopharyngeal(NP) swabs in vial transport medium  Result Value Ref Range Status   SARS Coronavirus 2 by RT PCR POSITIVE (A) NEGATIVE Final    Comment: RESULT CALLED TO, READ BACK BY AND VERIFIED WITH: PETRUCELLI RN/NP @ 00:57 09/29/20 BY BOWMAN,K  (NOTE) SARS-CoV-2 target nucleic acids are DETECTED.  The SARS-CoV-2 RNA is generally detectable in upper respiratory  specimens during the acute phase of infection. Positive results are indicative of the presence of the identified virus, but do not rule out bacterial infection or co-infection with other pathogens not detected by the test. Clinical correlation with patient history and other diagnostic information is necessary to determine patient infection status. The expected result is Negative.  Fact Sheet for Patients: BloggerCourse.com  Fact Sheet for Healthcare Providers: SeriousBroker.it  This test is not yet approved or cleared by the [place] FDA and  has been authorized for detection and/or diagnosis of SARS-CoV-2 by FDA under an Emergency Use Authorization (EUA).  This EUA will remain in effect (meaning thi s test can be used) for the duration of  the COVID-19 declaration under Section 564(b)(1) of the Act, 21 U.S.C. section 360bbb-3(b)(1), unless the authorization is terminated or revoked sooner.     Influenza A by PCR NEGATIVE NEGATIVE Final   Influenza B by PCR NEGATIVE NEGATIVE Final    Comment: (NOTE) The Xpert Xpress SARS-CoV-2/FLU/RSV plus assay is intended as an aid in the diagnosis of influenza from Nasopharyngeal swab specimens and should not be used as a  sole basis for treatment. Nasal washings and aspirates are unacceptable for Xpert Xpress SARS-CoV-2/FLU/RSV testing.  Fact Sheet for Patients: BloggerCourse.com  Fact Sheet for Healthcare Providers: SeriousBroker.it  This test is not yet approved or cleared by the [place] FDA and has been authorized for detection and/or diagnosis of SARS-CoV-2 by FDA under an Emergency Use Authorization (EUA). This EUA will remain in effect (meaning this test can be used) for the duration of the COVID-19 declaration under Section 564(b)(1) of the Act, 21 U.S.C. section 360bbb-3(b)(1), unless the authorization is terminated or revoked.  Performed at [facility], [address] [place], [place] [address]   Blood Culture (routine x 2)     Status: None (Preliminary result)   Collection Time: 09/28/20 11:07 PM   Specimen: BLOOD  Result Value Ref Range Status   Specimen Description   Final    BLOOD BLOOD RIGHT HAND Performed at [facility], [address], [place], [place] [address]    Special Requests   Final    BOTTLES DRAWN AEROBIC AND ANAEROBIC Blood Culture results may not be optimal due to an excessive volume of blood received in culture bottles Performed at [facility], [address], [place], [place] [address]    Culture   Final    NO GROWTH 2 DAYS Performed at [facility], [address], [place], [place] [address]    Report Status PENDING  Incomplete  Blood Culture (routine x 2)     Status: Abnormal   Collection Time: 09/28/20 11:07 PM   Specimen: BLOOD  Result Value Ref Range Status   Specimen Description   Final    BLOOD LEFT ANTECUBITAL Performed at [facility], [address] [place], [place] [address]    Special Requests   Final    BOTTLES DRAWN AEROBIC AND ANAEROBIC Blood Culture results may not be optimal due to an excessive volume  of blood received in culture bottles Performed at [facility], [address] [place], [place] [address]    Culture  Setup Time   Final    GRAM POSITIVE COCCI IN CLUSTERS IN BOTH AEROBIC AND ANAEROBIC BOTTLES Organism ID to follow CRITICAL RESULT CALLED TO, READ BACK BY AND VERIFIED WITH: A [facility] PHARMD [id number] 09/29/20 A BROWNING    Culture (A)  Final    STAPHYLOCOCCUS HOMINIS THE SIGNIFICANCE OF ISOLATING THIS ORGANISM FROM A SINGLE SET OF BLOOD CULTURES WHEN MULTIPLE SETS ARE DRAWN IS UNCERTAIN. PLEASE NOTIFY THE MICROBIOLOGY DEPARTMENT WITHIN ONE WEEK IF SPECIATION AND SENSITIVITIES ARE REQUIRED. Performed at [facility], [address] [place], [place] [address]    Report Status 10/01/2020 FINAL  Final  Blood Culture ID Panel (Reflexed)     Status: Abnormal   Collection Time: 09/28/20 11:07 PM  Result Value Ref Range Status   Enterococcus faecalis NOT DETECTED NOT DETECTED Final   Enterococcus Faecium NOT DETECTED NOT DETECTED Final   Listeria monocytogenes NOT DETECTED NOT DETECTED Final   Staphylococcus species DETECTED (A) NOT DETECTED Final    Comment: CRITICAL RESULT CALLED TO, READ BACK BY AND VERIFIED WITH: A [facility] PHARMD [id number] 09/29/20 A BROWNING    Staphylococcus aureus (BCID) NOT DETECTED NOT DETECTED Final   Staphylococcus epidermidis NOT DETECTED NOT DETECTED Final   Staphylococcus lugdunensis NOT DETECTED NOT DETECTED Final   Streptococcus species NOT DETECTED NOT DETECTED Final   Streptococcus agalactiae NOT DETECTED NOT DETECTED Final   Streptococcus pneumoniae NOT DETECTED NOT DETECTED Final   Streptococcus pyogenes NOT DETECTED NOT DETECTED Final   A.calcoaceticus-baumannii NOT DETECTED NOT DETECTED Final   Bacteroides fragilis NOT DETECTED NOT DETECTED Final   Enterobacterales NOT DETECTED NOT DETECTED Final   Enterobacter cloacae complex NOT DETECTED NOT DETECTED Final   Escherichia coli NOT DETECTED NOT DETECTED Final   Klebsiella  aerogenes NOT DETECTED NOT DETECTED Final   Klebsiella oxytoca NOT DETECTED NOT DETECTED Final   Klebsiella pneumoniae NOT DETECTED NOT DETECTED Final   Proteus species NOT DETECTED NOT DETECTED Final   Salmonella species NOT DETECTED NOT DETECTED Final   Serratia marcescens NOT DETECTED NOT DETECTED Final   Haemophilus influenzae NOT DETECTED NOT DETECTED Final   Neisseria meningitidis NOT DETECTED NOT DETECTED Final   Pseudomonas aeruginosa NOT DETECTED NOT DETECTED Final   Stenotrophomonas maltophilia NOT DETECTED NOT DETECTED Final   Candida albicans NOT DETECTED NOT DETECTED Final   Candida auris NOT DETECTED NOT DETECTED Final   Candida glabrata NOT DETECTED NOT DETECTED Final   Candida krusei NOT DETECTED NOT DETECTED Final   Candida parapsilosis NOT DETECTED NOT DETECTED Final   Candida tropicalis NOT DETECTED NOT DETECTED Final   Cryptococcus neoformans/gattii NOT DETECTED NOT DETECTED Final    Comment: Performed at [facility], [address] [place], [place] [address]  Respiratory Panel by PCR     Status: None   Collection Time: 09/30/20  6:14 PM  Result Value Ref Range Status   Adenovirus NOT DETECTED NOT DETECTED Final   Coronavirus 229E NOT DETECTED NOT DETECTED Final    Comment: (NOTE) The Coronavirus on the Respiratory Panel, DOES NOT test for the novel  Coronavirus (2019 nCoV)    Coronavirus HKU1 NOT DETECTED NOT DETECTED Final   Coronavirus NL63 NOT DETECTED NOT DETECTED Final   Coronavirus OC43 NOT DETECTED NOT DETECTED Final   Metapneumovirus NOT DETECTED NOT DETECTED Final   Rhinovirus / Enterovirus NOT DETECTED NOT DETECTED Final   Influenza A NOT DETECTED NOT DETECTED Final   Influenza B NOT DETECTED NOT DETECTED Final   Parainfluenza Virus 1 NOT DETECTED NOT DETECTED Final   Parainfluenza Virus 2 NOT DETECTED NOT DETECTED Final   Parainfluenza Virus 3 NOT DETECTED NOT DETECTED Final   Parainfluenza Virus 4 NOT DETECTED NOT DETECTED Final    Respiratory Syncytial Virus NOT DETECTED NOT  DETECTED Final   Bordetella pertussis NOT DETECTED NOT DETECTED Final   Chlamydophila pneumoniae NOT DETECTED NOT DETECTED Final   Mycoplasma pneumoniae NOT DETECTED NOT DETECTED Final    Comment: Performed at [facility], [address], [place], [place] [address]      Radiology Studies: No results found.  Scheduled Meds: . vitamin C  500 mg Oral Daily  . baricitinib  4 mg Oral Daily  . cholecalciferol  1,000 Units Oral Daily  . enoxaparin (LOVENOX) injection  55 mg Subcutaneous Q24H  . methylPREDNISolone (SOLU-MEDROL) injection  55 mg Intravenous Q12H   Followed by  . [START ON 10/02/2020] predniSONE  50 mg Oral Daily  . vitamin B-12  100 mcg Oral Daily  . zinc sulfate  220 mg Oral Daily   Continuous Infusions: . azithromycin 500 mg (10/01/20 0048)  . cefTRIAXone (ROCEPHIN)  IV Stopped (10/01/20 0700)  . remdesivir 100 mg in NS 100 mL Stopped (09/30/20 1145)     LOS: 2 days   Time spent: 35 minutes.  Tyrone Nine, MD [facility] [email or web address] 10/01/2020, 11:05 AM

## 2020-10-01 NOTE — Plan of Care (Signed)
  Problem: Education: Goal: Knowledge of risk factors and measures for prevention of condition will improve Outcome: Progressing   Problem: Coping: Goal: Psychosocial and spiritual needs will be supported Outcome: Progressing   Problem: Respiratory: Goal: Will maintain a patent airway Outcome: Not Progressing Goal: Complications related to the disease process, condition or treatment will be avoided or minimized Outcome: Not Progressing   

## 2020-10-01 NOTE — TOC Progression Note (Signed)
Transition of Care [facility]) - Progression Note    Patient Details  Name: Ashley Adams MRN: [id number] Date of Birth: 12-22-1970  Transition of [facility]) CM/SW Contact  Geni Bers, RN Phone Number: 10/01/2020, 1:11 PM  Clinical Narrative:     Pt from home and plan to discharge home. TOC will continue to follow.   Expected Discharge Plan: Home/Self Care Barriers to Discharge: No Barriers Identified  Expected Discharge Plan and Services Expected Discharge Plan: Home/Self Care   Discharge Planning Services: CM Consult   Living arrangements for the past 2 months: Single Family Home                                       Social Determinants of Health (SDOH) Interventions    Readmission Risk Interventions No flowsheet data found.

## 2020-10-01 NOTE — Progress Notes (Signed)
Pt's mother called for an update, however, pt requested that I not update her and that once she feels like calling her, she will call and update her herself.

## 2020-10-02 LAB — COMPREHENSIVE METABOLIC PANEL
ALT: 54 U/L — ABNORMAL HIGH (ref 0–44)
AST: 52 U/L — ABNORMAL HIGH (ref 15–41)
Albumin: 2.9 g/dL — ABNORMAL LOW (ref 3.5–5.0)
Alkaline Phosphatase: 46 U/L (ref 38–126)
Anion gap: 10 (ref 5–15)
BUN: 19 mg/dL (ref 6–20)
CO2: 24 mmol/L (ref 22–32)
Calcium: 8.4 mg/dL — ABNORMAL LOW (ref 8.9–10.3)
Chloride: 104 mmol/L (ref 98–111)
Creatinine, Ser: 0.48 mg/dL (ref 0.44–1.00)
GFR, Estimated: >60 mL/min (ref >60)
Glucose, Bld: 134 mg/dL — ABNORMAL HIGH (ref 70–99)
Potassium: 3.6 mmol/L (ref 3.5–5.1)
Sodium: 138 mmol/L (ref 135–145)
Total Bilirubin: 0.4 mg/dL (ref 0.3–1.2)
Total Protein: 6.4 g/dL — ABNORMAL LOW (ref 6.5–8.1)

## 2020-10-02 LAB — C-REACTIVE PROTEIN: CRP: 1.1 mg/dL — ABNORMAL HIGH (ref <1.0)

## 2020-10-02 LAB — D-DIMER, QUANTITATIVE: D-Dimer, Quant: 0.68 ug/mL-FEU — ABNORMAL HIGH (ref 0.00–0.50)

## 2020-10-02 NOTE — Progress Notes (Signed)
PROGRESS NOTE  Ashley Robinswana A Mceachern  ZHY:[id number] DOB: [birth year] DOA: 09/28/2020 PCP: Maryagnes AmosStarkes-Perry, Takia S, FNP   Brief Narrative: Ashley Adams is a 49 y.o. female with a history of HTN, OSA, obesity who presented 11/28 with cough and fever, found to be hypoxic to 83% on room air requiring supplemental oxygen. SARS-CoV-2 PCR was positive and CXR revealed multifocal opacities. Remdesivir and steroids were started. She continues to require supplemental oxygen.   Assessment & Plan: Principal Problem:   Pneumonia due to COVID-19 virus Active Problems:   Acute hypoxemic respiratory failure (HCC)   Sepsis (HCC)   Hypokalemia   Transaminitis  Acute hypoxemic respiratory failure due to covid-19 pneumonia: SARS-CoV-2 PCR positive on 11/28.  - Remains hypoxic, wean oxygen as tolerated, but supplement as needed to facilitate OOB. - Continue remdesivir x5 days (11/29 - 12/3) - Continue steroids. CRP continues improvement, d-dimer also down. Hypoxia remains and will need to be monitored closely over next 24 hours.  - Continue baricitinib (started 11/29 in response to advancing hypoxia) - PCT negative, stopped abx. - Encourage OOB, IS, FV, and awake proning if able - Continue airborne, contact precautions for 21 days from positive testing. - Monitor CMP and inflammatory markers - Enoxaparin prophylactic dose.   Blood culture contaminant: Staph hominis in 1 of 2 collections. PCT is negative. - Monitor further off of antibiotics.   LFT elevation: Mild, due to covid-19 most likely. Not currently a contraindication to therapies as above. - Continue monitoring.   Normocytic anemia: Chronic, no bleeding.  - Monitor intermittently  Obesity: Estimated body mass index is 37.67 kg/m as calculated from the following:   Height as of this encounter: 5\' 7"  (1.702 m).   Weight as of this encounter: 109.1 kg.  DVT prophylaxis: Lovenox Code Status: Full Family Communication: Sister by phone this  morning per pt request. Disposition Plan:  Status is: Inpatient  Remains inpatient appropriate because:Inpatient level of care appropriate due to severity of illness  Dispo: The patient is from: Home              Anticipated d/c is to: Home              Anticipated d/c date is: 3 days              Patient currently is not medically stable to d/c.  Consultants:   None  Procedures:   None  Antimicrobials:  Remdesivir 11/29 - 12/3  Ceftriaxone, azithromycin 11/30 - 12/1   Subjective: Using IS regularly, slept prone last night and actually felt less short of breath this morning. Hypoxia seems to have worsened however, though she denies chest pain or leg swelling or pain. Feels improved overall, ready to get up a bit today.  Objective: Vitals:   10/01/20 0535 10/01/20 1304 10/01/20 1400 10/01/20 2051  BP: 114/81 137/83  129/88  Pulse: 63 87  71  Resp: 20   18  Temp: 97.9 F (36.6 C) 98.4 F (36.9 C)  97.7 F (36.5 C)  TempSrc: Oral Oral  Oral  SpO2: 91% (!) 74% 91% 92%  Weight:      Height:       No intake or output data in the 24 hours ending 10/02/20 1155 Filed Weights   09/29/20 0230  Weight: 109.1 kg   Gen: 49 y.o. female in no distress Pulm: Nonlabored at rest, crackles bilaterally are stable without wheezes, moving good air. CV: Regular rate and rhythm. No murmur, rub, or gallop. No  JVD, no dependent edema. GI: Abdomen soft, non-tender, non-distended, with normoactive bowel sounds.  Ext: Warm, no deformities Skin: No rashes, lesions or ulcers on visualized skin. Neuro: Alert and oriented. No focal neurological deficits. Psych: Judgement and insight appear fair. Mood euthymic & affect congruent. Behavior is appropriate.    Data Reviewed: I have personally reviewed following labs and imaging studies  CBC: Recent Labs  Lab 09/28/20 2307 09/30/20 0545 10/01/20 0517  WBC 7.9 4.7 9.4  NEUTROABS 6.8 3.6 7.7  HGB 11.4* 10.9* 11.2*  HCT 35.4* 33.8* 36.5    MCV 89.2 89.4 93.1  PLT 230 248 328   Basic Metabolic Panel: Recent Labs  Lab 09/28/20 2307 09/29/20 0439 09/30/20 0545 10/01/20 0517 10/02/20 0719  NA 139  --  139 139 138  K 3.2*  --  3.7 3.6 3.6  CL 99  --  108 105 104  CO2 27  --  22 24 24   GLUCOSE 141*  --  135* 140* 134*  BUN 13  --  21* 26* 19  CREATININE 0.70  --  0.63 0.73 0.48  CALCIUM 8.4*  --  8.3* 8.5* 8.4*  MG  --  2.0  --   --   --    GFR: Estimated Creatinine Clearance: 107 mL/min (by C-G formula based on SCr of 0.48 mg/dL). Liver Function Tests: Recent Labs  Lab 09/28/20 2307 09/30/20 0545 10/01/20 0517 10/02/20 0719  AST 89* 75* 76* 52*  ALT 46* 50* 60* 54*  ALKPHOS 49 41 45 46  BILITOT 0.5 0.3 0.4 0.4  PROT 6.9 6.3* 6.7 6.4*  ALBUMIN 3.2* 2.8* 2.9* 2.9*   No results for input(s): LIPASE, AMYLASE in the last 168 hours. No results for input(s): AMMONIA in the last 168 hours. Coagulation Profile: No results for input(s): INR, PROTIME in the last 168 hours. Cardiac Enzymes: No results for input(s): CKTOTAL, CKMB, CKMBINDEX, TROPONINI in the last 168 hours. BNP (last 3 results) No results for input(s): PROBNP in the last 8760 hours. HbA1C: No results for input(s): HGBA1C in the last 72 hours. CBG: No results for input(s): GLUCAP in the last 168 hours. Lipid Profile: No results for input(s): CHOL, HDL, LDLCALC, TRIG, CHOLHDL, LDLDIRECT in the last 72 hours. Thyroid Function Tests: No results for input(s): TSH, T4TOTAL, FREET4, T3FREE, THYROIDAB in the last 72 hours. Anemia Panel: No results for input(s): VITAMINB12, FOLATE, FERRITIN, TIBC, IRON, RETICCTPCT in the last 72 hours. Urine analysis:    Component Value Date/Time   COLORURINE YELLOW 05/09/2012 0725   APPEARANCEUR HAZY (A) 05/09/2012 0725   LABSPEC 1.020 05/09/2012 0725   PHURINE 6.0 05/09/2012 0725   GLUCOSEU NEGATIVE 05/09/2012 0725   HGBUR NEGATIVE 05/09/2012 0725   HGBUR negative 06/27/2009 1006   BILIRUBINUR large (A)  02/01/2018 1552   KETONESUR small (15) (A) 02/01/2018 1552   KETONESUR NEGATIVE 05/09/2012 0725   PROTEINUR >=300 (A) 02/01/2018 1552   PROTEINUR NEGATIVE 05/09/2012 0725   UROBILINOGEN 2.0 (A) 02/01/2018 1552   UROBILINOGEN 0.2 05/09/2012 0725   NITRITE Positive (A) 02/01/2018 1552   NITRITE NEGATIVE 05/09/2012 0725   LEUKOCYTESUR Negative 02/01/2018 1552   Recent Results (from the past 240 hour(s))  Resp Panel by RT-PCR (Flu A&B, Covid) Nasopharyngeal Swab     Status: Abnormal   Collection Time: 09/28/20 10:43 PM   Specimen: Nasopharyngeal Swab; Nasopharyngeal(NP) swabs in vial transport medium  Result Value Ref Range Status   SARS Coronavirus 2 by RT PCR POSITIVE (A) NEGATIVE Final  Comment: RESULT CALLED TO, READ BACK BY AND VERIFIED WITH: PETRUCELLI RN/NP @ 00:57 09/29/20 BY BOWMAN,K  (NOTE) SARS-CoV-2 target nucleic acids are DETECTED.  The SARS-CoV-2 RNA is generally detectable in upper respiratory specimens during the acute phase of infection. Positive results are indicative of the presence of the identified virus, but do not rule out bacterial infection or co-infection with other pathogens not detected by the test. Clinical correlation with patient history and other diagnostic information is necessary to determine patient infection status. The expected result is Negative.  Fact Sheet for Patients: BloggerCourse.com  Fact Sheet for Healthcare Providers: SeriousBroker.it  This test is not yet approved or cleared by the [place] FDA and  has been authorized for detection and/or diagnosis of SARS-CoV-2 by FDA under an Emergency Use Authorization (EUA).  This EUA will remain in effect (meaning thi s test can be used) for the duration of  the COVID-19 declaration under Section 564(b)(1) of the Act, 21 U.S.C. section 360bbb-3(b)(1), unless the authorization is terminated or revoked sooner.     Influenza A by PCR  NEGATIVE NEGATIVE Final   Influenza B by PCR NEGATIVE NEGATIVE Final    Comment: (NOTE) The Xpert Xpress SARS-CoV-2/FLU/RSV plus assay is intended as an aid in the diagnosis of influenza from Nasopharyngeal swab specimens and should not be used as a sole basis for treatment. Nasal washings and aspirates are unacceptable for Xpert Xpress SARS-CoV-2/FLU/RSV testing.  Fact Sheet for Patients: BloggerCourse.com  Fact Sheet for Healthcare Providers: SeriousBroker.it  This test is not yet approved or cleared by the [place] FDA and has been authorized for detection and/or diagnosis of SARS-CoV-2 by FDA under an Emergency Use Authorization (EUA). This EUA will remain in effect (meaning this test can be used) for the duration of the COVID-19 declaration under Section 564(b)(1) of the Act, 21 U.S.C. section 360bbb-3(b)(1), unless the authorization is terminated or revoked.  Performed at [facility], [address], [place], [place] [address]   Blood Culture (routine x 2)     Status: None (Preliminary result)   Collection Time: 09/28/20 11:07 PM   Specimen: BLOOD  Result Value Ref Range Status   Specimen Description   Final    BLOOD BLOOD RIGHT HAND Performed at [facility], [address], [place], [place] [address]    Special Requests   Final    BOTTLES DRAWN AEROBIC AND ANAEROBIC Blood Culture results may not be optimal due to an excessive volume of blood received in culture bottles Performed at [facility], [address] [place], [place] [address]    Culture   Final    NO GROWTH 3 DAYS Performed at [facility], [address], [place], [place] [address]    Report Status PENDING  Incomplete  Blood Culture (routine x 2)     Status: Abnormal   Collection Time: 09/28/20 11:07 PM   Specimen: BLOOD  Result Value Ref Range Status   Specimen Description   Final      BLOOD LEFT ANTECUBITAL Performed at [facility], [address], [place], [place] [address]    Special Requests   Final    BOTTLES DRAWN AEROBIC AND ANAEROBIC Blood Culture results may not be optimal due to an excessive volume of blood received in culture bottles Performed at [facility], [address], [place], [place] [address]    Culture  Setup Time   Final    GRAM POSITIVE  COCCI IN CLUSTERS IN BOTH AEROBIC AND ANAEROBIC BOTTLES Organism ID to follow CRITICAL RESULT CALLED TO, READ BACK BY AND VERIFIED WITH: A PHAM PHARMD [id number] 09/29/20 A BROWNING    Culture (A)  Final    STAPHYLOCOCCUS HOMINIS STAPHYLOCOCCUS CAPITIS THE SIGNIFICANCE OF ISOLATING THIS ORGANISM FROM A SINGLE SET OF BLOOD CULTURES WHEN MULTIPLE SETS ARE DRAWN IS UNCERTAIN. PLEASE NOTIFY THE MICROBIOLOGY DEPARTMENT WITHIN ONE WEEK IF SPECIATION AND SENSITIVITIES ARE REQUIRED. Performed at [facility], [address] [place], [place] [address]    Report Status 10/01/2020 FINAL  Final  Blood Culture ID Panel (Reflexed)     Status: Abnormal   Collection Time: 09/28/20 11:07 PM  Result Value Ref Range Status   Enterococcus faecalis NOT DETECTED NOT DETECTED Final   Enterococcus Faecium NOT DETECTED NOT DETECTED Final   Listeria monocytogenes NOT DETECTED NOT DETECTED Final   Staphylococcus species DETECTED (A) NOT DETECTED Final    Comment: CRITICAL RESULT CALLED TO, READ BACK BY AND VERIFIED WITH: A PHAM PHARMD [id number] 09/29/20 A BROWNING    Staphylococcus aureus (BCID) NOT DETECTED NOT DETECTED Final   Staphylococcus epidermidis NOT DETECTED NOT DETECTED Final   Staphylococcus lugdunensis NOT DETECTED NOT DETECTED Final   Streptococcus species NOT DETECTED NOT DETECTED Final   Streptococcus agalactiae NOT DETECTED NOT DETECTED Final   Streptococcus pneumoniae NOT DETECTED NOT DETECTED Final   Streptococcus pyogenes NOT DETECTED NOT DETECTED Final    A.calcoaceticus-baumannii NOT DETECTED NOT DETECTED Final   Bacteroides fragilis NOT DETECTED NOT DETECTED Final   Enterobacterales NOT DETECTED NOT DETECTED Final   Enterobacter cloacae complex NOT DETECTED NOT DETECTED Final   Escherichia coli NOT DETECTED NOT DETECTED Final   Klebsiella aerogenes NOT DETECTED NOT DETECTED Final   Klebsiella oxytoca NOT DETECTED NOT DETECTED Final   Klebsiella pneumoniae NOT DETECTED NOT DETECTED Final   Proteus species NOT DETECTED NOT DETECTED Final   Salmonella species NOT DETECTED NOT DETECTED Final   Serratia marcescens NOT DETECTED NOT DETECTED Final   Haemophilus influenzae NOT DETECTED NOT DETECTED Final   Neisseria meningitidis NOT DETECTED NOT DETECTED Final   Pseudomonas aeruginosa NOT DETECTED NOT DETECTED Final   Stenotrophomonas maltophilia NOT DETECTED NOT DETECTED Final   Candida albicans NOT DETECTED NOT DETECTED Final   Candida auris NOT DETECTED NOT DETECTED Final   Candida glabrata NOT DETECTED NOT DETECTED Final   Candida krusei NOT DETECTED NOT DETECTED Final   Candida parapsilosis NOT DETECTED NOT DETECTED Final   Candida tropicalis NOT DETECTED NOT DETECTED Final   Cryptococcus neoformans/gattii NOT DETECTED NOT DETECTED Final    Comment: Performed at [facility], [address] [place], [place] [address]  Respiratory Panel by PCR     Status: None   Collection Time: 09/30/20  6:14 PM  Result Value Ref Range Status   Adenovirus NOT DETECTED NOT DETECTED Final   Coronavirus 229E NOT DETECTED NOT DETECTED Final    Comment: (NOTE) The Coronavirus on the Respiratory Panel, DOES NOT test for the novel  Coronavirus (2019 nCoV)    Coronavirus HKU1 NOT DETECTED NOT DETECTED Final   Coronavirus NL63 NOT DETECTED NOT DETECTED Final   Coronavirus OC43 NOT DETECTED NOT DETECTED Final   Metapneumovirus NOT DETECTED NOT DETECTED Final   Rhinovirus / Enterovirus NOT DETECTED NOT DETECTED Final   Influenza A NOT DETECTED NOT  DETECTED Final   Influenza B NOT DETECTED NOT DETECTED Final   Parainfluenza Virus 1 NOT DETECTED NOT DETECTED Final  Parainfluenza Virus 2 NOT DETECTED NOT DETECTED Final   Parainfluenza Virus 3 NOT DETECTED NOT DETECTED Final   Parainfluenza Virus 4 NOT DETECTED NOT DETECTED Final   Respiratory Syncytial Virus NOT DETECTED NOT DETECTED Final   Bordetella pertussis NOT DETECTED NOT DETECTED Final   Chlamydophila pneumoniae NOT DETECTED NOT DETECTED Final   Mycoplasma pneumoniae NOT DETECTED NOT DETECTED Final    Comment: Performed at [facility], [address] [place], [place] [address]      Radiology Studies: No results found.  Scheduled Meds:  vitamin C  500 mg Oral Daily   baricitinib  4 mg Oral Daily   cholecalciferol  1,000 Units Oral Daily   enoxaparin (LOVENOX) injection  55 mg Subcutaneous Q24H   methylPREDNISolone (SOLU-MEDROL) injection  60 mg Intravenous Q12H   vitamin B-12  100 mcg Oral Daily   zinc sulfate  220 mg Oral Daily   Continuous Infusions:  remdesivir 100 mg in NS 100 mL 100 mg (10/02/20 0936)     LOS: 3 days   Time spent: 35 minutes.  Tyrone Nine, MD [facility] [email or web address] 10/02/2020, 11:55 AM

## 2020-10-02 NOTE — Progress Notes (Signed)
Patient was started this am by lab tech in which her sats dropped in the 70's alone with an incontinent episode; placed patient on non-re-breather mask sats increased 93%.

## 2020-10-02 NOTE — Plan of Care (Signed)

## 2020-10-03 LAB — COMPREHENSIVE METABOLIC PANEL
ALT: 53 U/L — ABNORMAL HIGH (ref 0–44)
AST: 45 U/L — ABNORMAL HIGH (ref 15–41)
Albumin: 3 g/dL — ABNORMAL LOW (ref 3.5–5.0)
Alkaline Phosphatase: 49 U/L (ref 38–126)
Anion gap: 10 (ref 5–15)
BUN: 23 mg/dL — ABNORMAL HIGH (ref 6–20)
CO2: 27 mmol/L (ref 22–32)
Calcium: 8.5 mg/dL — ABNORMAL LOW (ref 8.9–10.3)
Chloride: 103 mmol/L (ref 98–111)
Creatinine, Ser: 0.63 mg/dL (ref 0.44–1.00)
GFR, Estimated: >60 mL/min (ref >60)
Glucose, Bld: 116 mg/dL — ABNORMAL HIGH (ref 70–99)
Potassium: 3.6 mmol/L (ref 3.5–5.1)
Sodium: 140 mmol/L (ref 135–145)
Total Bilirubin: 0.5 mg/dL (ref 0.3–1.2)
Total Protein: 6.6 g/dL (ref 6.5–8.1)

## 2020-10-03 LAB — C-REACTIVE PROTEIN: CRP: 0.8 mg/dL (ref <1.0)

## 2020-10-03 LAB — D-DIMER, QUANTITATIVE: D-Dimer, Quant: 1.02 ug/mL-FEU — ABNORMAL HIGH (ref 0.00–0.50)

## 2020-10-03 NOTE — Progress Notes (Signed)
Patient continues to use the NRB mask and sats at 95-100%. She states she use her IS while she is awake. I ask patient if she would try and use the nasal cannula while up into chair she decline. Patient is A/O x 4 but she had 2 incontinent episodes of bowel and bladder while sitting in her chair.Patient also prefer lab to come after 7 because she does not want to be waking up to early.

## 2020-10-03 NOTE — Plan of Care (Signed)

## 2020-10-03 NOTE — Progress Notes (Signed)
PROGRESS NOTE  Ashley Adams  IRJ:[id number] DOB: [birth year] DOA: 09/28/2020 PCP: Maryagnes Amos, FNP   Brief Narrative: Ashley Adams is a 49 y.o. female with a history of HTN, OSA, obesity who presented 11/28 with cough and fever, found to be hypoxic to 83% on room air requiring supplemental oxygen. SARS-CoV-2 PCR was positive and CXR revealed multifocal opacities. Remdesivir and steroids were started. She continues to require supplemental oxygen.   Assessment & Plan: Principal Problem:   Pneumonia due to COVID-19 virus Active Problems:   Acute hypoxemic respiratory failure (HCC)   Sepsis (HCC)   Hypokalemia   Transaminitis  Acute hypoxemic respiratory failure due to covid-19 pneumonia: SARS-CoV-2 PCR positive on 11/28.  - Remains hypoxic but stabilized, encouraged weaning NRB to facilitate OOB. Will repeat CXR in AM, though suspect atelectasis is primary driver of failure to wean oxygen at this point. Will get PT and OT to assist with mobility.  - Complete remdesivir x5 days (11/29 - 12/3) - Continue steroids at current dose until hypoxia improves. CRP continues improvement.  - Continue baricitinib (started 11/29 in response to advancing hypoxia) - Encourage OOB, IS, FV, and awake proning if able - Continue airborne, contact precautions for 21 days from positive testing. - Monitor CMP and inflammatory markers - Enoxaparin prophylactic dose. D-dimer improving, 0.68 on 12/3  Blood culture contaminant: Staph hominis, capitis in 1 of 2 collections. PCT is negative. - Remains afebrile off of antibiotics.   LFT elevation: Mild, due to covid-19 most likely. Not currently a contraindication to therapies as above. - Continue monitoring.   Normocytic anemia: Chronic, no bleeding.  - Monitor intermittently  Incontinence: Related to stress/coughing and worsened by dyspnea on exertion limiting ability to get to [facility]  - Recommend frequent toileting. No red flags on exam or by  history.  Obesity: Estimated body mass index is 37.67 kg/m as calculated from the following:   Height as of this encounter: 5\' 7"  (1.702 m).   Weight as of this encounter: 109.1 kg.  DVT prophylaxis: Lovenox Code Status: Full Family Communication: Sister by phone  Disposition Plan:  Status is: Inpatient  Remains inpatient appropriate because:Inpatient level of care appropriate due to severity of illness  Dispo: The patient is from: Home              Anticipated d/c is to: Home              Anticipated d/c date is: 3 days              Patient currently is not medically stable to d/c.  Consultants:   None  Procedures:   None  Antimicrobials:  Remdesivir 11/29 - 12/3  Ceftriaxone, azithromycin 11/30 - 12/1   Subjective: Continues to use IS regularly, getting up but not very often, feels very weak but not unsteady on her feet. Having incontinent episodes she reports when she has coughing spells, denies saddle paresthesias.   Objective: Vitals:   10/01/20 2051 10/02/20 1354 10/02/20 2037 10/03/20 0625  BP: 129/88 (!) 144/85 129/87 137/86  Pulse: 71 82 77 73  Resp: 18 14 18    Temp: 97.7 F (36.5 C) 98.3 F (36.8 C) 98.1 F (36.7 C) 97.6 F (36.4 C)  TempSrc: Oral Oral Oral Oral  SpO2: 92% (!) 86% 92% 96%  Weight:      Height:       No intake or output data in the 24 hours ending 10/03/20 0837 Filed Weights   09/29/20  0230  Weight: 109.1 kg   Gen: 49 y.o. female in no distress Pulm: Nonlabored NRB, Very diminished. Crackles bilaterally are stable without wheezes. CV: Regular rate and rhythm. No murmur, rub, or gallop. No JVD, no dependent edema. GI: Abdomen soft, non-tender, non-distended, with normoactive bowel sounds.  Ext: Warm, no deformities Skin: No rashes, lesions or ulcers on visualized skin. Neuro: Alert and oriented. No focal neurological deficits. LE's with intact sensation and motor function. Psych: Judgement and insight appear fair. Mood euthymic  & affect congruent. Behavior is appropriate.    Data Reviewed: I have personally reviewed following labs and imaging studies  CBC: Recent Labs  Lab 09/28/20 2307 09/30/20 0545 10/01/20 0517  WBC 7.9 4.7 9.4  NEUTROABS 6.8 3.6 7.7  HGB 11.4* 10.9* 11.2*  HCT 35.4* 33.8* 36.5  MCV 89.2 89.4 93.1  PLT 230 248 328   Basic Metabolic Panel: Recent Labs  Lab 09/28/20 2307 09/29/20 0439 09/30/20 0545 10/01/20 0517 10/02/20 0719  NA 139  --  139 139 138  K 3.2*  --  3.7 3.6 3.6  CL 99  --  108 105 104  CO2 27  --  GLUCOSE 141*  --  135* 140* 134*  BUN 13  --  21* 26* 19  CREATININE 0.70  --  0.63 0.73 0.48  CALCIUM 8.4*  --  8.3* 8.5* 8.4*  MG  --  2.0  --   --   --    GFR: Estimated Creatinine Clearance: 107 mL/min (by C-G formula based on SCr of 0.48 mg/dL). Liver Function Tests: Recent Labs  Lab 09/28/20 2307 09/30/20 0545 10/01/20 0517 10/02/20 0719  AST 89* 75* 76* 52*  ALT 46* 50* 60* 54*  ALKPHOS 49 41 45 46  BILITOT 0.5 0.3 0.4 0.4  PROT 6.9 6.3* 6.7 6.4*  ALBUMIN 3.2* 2.8* 2.9* 2.9*   Urine analysis:    Component Value Date/Time   COLORURINE YELLOW 05/09/2012 0725   APPEARANCEUR HAZY (A) 05/09/2012 0725   LABSPEC 1.020 05/09/2012 0725   PHURINE 6.0 05/09/2012 0725   GLUCOSEU NEGATIVE 05/09/2012 0725   HGBUR NEGATIVE 05/09/2012 0725   HGBUR negative 06/27/2009 1006   BILIRUBINUR large (A) 02/01/2018 1552   KETONESUR small (15) (A) 02/01/2018 1552   KETONESUR NEGATIVE 05/09/2012 0725   PROTEINUR >=300 (A) 02/01/2018 1552   PROTEINUR NEGATIVE 05/09/2012 0725   UROBILINOGEN 2.0 (A) 02/01/2018 1552   UROBILINOGEN 0.2 05/09/2012 0725   NITRITE Positive (A) 02/01/2018 1552   NITRITE NEGATIVE 05/09/2012 0725   LEUKOCYTESUR Negative 02/01/2018 1552   Recent Results (from the past 240 hour(s))  Resp Panel by RT-PCR (Flu A&B, Covid) Nasopharyngeal Swab     Status: Abnormal   Collection Time: 09/28/20 10:43 PM   Specimen: Nasopharyngeal Swab;  Nasopharyngeal(NP) swabs in vial transport medium  Result Value Ref Range Status   SARS Coronavirus 2 by RT PCR POSITIVE (A) NEGATIVE Final    Comment: RESULT CALLED TO, READ BACK BY AND VERIFIED WITH: PETRUCELLI RN/NP @ 00:57 09/29/20 BY BOWMAN,K  (NOTE) SARS-CoV-2 target nucleic acids are DETECTED.  The SARS-CoV-2 RNA is generally detectable in upper respiratory specimens during the acute phase of infection. Positive results are indicative of the presence of the identified virus, but do not rule out bacterial infection or co-infection with other pathogens not detected by the test. Clinical correlation with patient history and other diagnostic information is necessary to determine pati99ent infection status. The expected result is Negative.  Fact  Sheet for Patients: BloggerCourse.com  Fact Sheet for Healthcare Providers: SeriousBroker.it  This test is not yet approved or cleared by the [place] FDA and  has been authorized for detection and/or diagnosis of SARS-CoV-2 by FDA under an Emergency Use Authorization (EUA).  This EUA will remain in effect (meaning thi s test can be used) for the duration of  the COVID-19 declaration under Section 564(b)(1) of the Act, 21 U.S.C. section 360bbb-3(b)(1), unless the authorization is terminated or revoked sooner.     Influenza A by PCR NEGATIVE NEGATIVE Final   Influenza B by PCR NEGATIVE NEGATIVE Final    Comment: (NOTE) The Xpert Xpress SARS-CoV-2/FLU/RSV plus assay is intended as an aid in the diagnosis of influenza from Nasopharyngeal swab specimens and should not be used as a sole basis for treatment. Nasal washings and aspirates are unacceptable for Xpert Xpress SARS-CoV-2/FLU/RSV testing.  Fact Sheet for Patients: BloggerCourse.com  Fact Sheet for Healthcare Providers: SeriousBroker.it  This test is not yet approved or  cleared by the [place] FDA and has been authorized for detection and/or diagnosis of SARS-CoV-2 by FDA under an Emergency Use Authorization (EUA). This EUA will remain in effect (meaning this test can be used) for the duration of the COVID-19 declaration under Section 564(b)(1) of the Act, 21 U.S.C. section 360bbb-3(b)(1), unless the authorization is terminated or revoked.  Performed at [facility], [address], [place], [place] [address]   Blood Culture (routine x 2)     Status: None (Preliminary result)   Collection Time: 09/28/20 11:07 PM   Specimen: BLOOD  Result Value Ref Range Status   Specimen Description   Final    BLOOD BLOOD RIGHT HAND Performed at [facility], [address], [place], [place] [address]    Special Requests   Final    BOTTLES DRAWN AEROBIC AND ANAEROBIC Blood Culture results may not be optimal due to an excessive volume of blood received in culture bottles Performed at [facility], [address], [place], [place] [address]    Culture   Final    NO GROWTH 4 DAYS Performed at [facility], [address] [place], [place] [address]    Report Status PENDING  Incomplete  Blood Culture (routine x 2)     Status: Abnormal   Collection Time: 09/28/20 11:07 PM   Specimen: BLOOD  Result Value Ref Range Status   Specimen Description   Final    BLOOD LEFT ANTECUBITAL Performed at [facility], [address], [place], [place] [address]    Special Requests   Final    BOTTLES DRAWN AEROBIC AND ANAEROBIC Blood Culture results may not be optimal due to an excessive volume of blood received in culture bottles Performed at [facility], [address], [place], [place] [address]    Culture  Setup Time   Final    GRAM POSITIVE COCCI IN CLUSTERS IN BOTH AEROBIC AND ANAEROBIC BOTTLES Organism ID to follow CRITICAL RESULT CALLED TO, READ BACK BY AND  VERIFIED WITH: A PHAM PHARMD [id number] 09/29/20 A BROWNING    Culture (A)  Final    STAPHYLOCOCCUS HOMINIS STAPHYLOCOCCUS CAPITIS THE SIGNIFICANCE OF ISOLATING THIS ORGANISM FROM A SINGLE SET OF BLOOD CULTURES WHEN MULTIPLE SETS ARE DRAWN IS UNCERTAIN. PLEASE NOTIFY THE MICROBIOLOGY DEPARTMENT WITHIN ONE WEEK IF SPECIATION AND SENSITIVITIES ARE REQUIRED. Performed at [facility], [address] [place], [place] [address]  Report Status 10/01/2020 FINAL  Final  Blood Culture ID Panel (Reflexed)     Status: Abnormal   Collection Time: 09/28/20 11:07 PM  Result Value Ref Range Status   Enterococcus faecalis NOT DETECTED NOT DETECTED Final   Enterococcus Faecium NOT DETECTED NOT DETECTED Final   Listeria monocytogenes NOT DETECTED NOT DETECTED Final   Staphylococcus species DETECTED (A) NOT DETECTED Final    Comment: CRITICAL RESULT CALLED TO, READ BACK BY AND VERIFIED WITH: A PHAM PHARMD [id number] 09/29/20 A BROWNING    Staphylococcus aureus (BCID) NOT DETECTED NOT DETECTED Final   Staphylococcus epidermidis NOT DETECTED NOT DETECTED Final   Staphylococcus lugdunensis NOT DETECTED NOT DETECTED Final   Streptococcus species NOT DETECTED NOT DETECTED Final   Streptococcus agalactiae NOT DETECTED NOT DETECTED Final   Streptococcus pneumoniae NOT DETECTED NOT DETECTED Final   Streptococcus pyogenes NOT DETECTED NOT DETECTED Final   A.calcoaceticus-baumannii NOT DETECTED NOT DETECTED Final   Bacteroides fragilis NOT DETECTED NOT DETECTED Final   Enterobacterales NOT DETECTED NOT DETECTED Final   Enterobacter cloacae complex NOT DETECTED NOT DETECTED Final   Escherichia coli NOT DETECTED NOT DETECTED Final   Klebsiella aerogenes NOT DETECTED NOT DETECTED Final   Klebsiella oxytoca NOT DETECTED NOT DETECTED Final   Klebsiella pneumoniae NOT DETECTED NOT DETECTED Final   Proteus species NOT DETECTED NOT DETECTED Final   Salmonella species NOT DETECTED NOT DETECTED Final   Serratia marcescens  NOT DETECTED NOT DETECTED Final   Haemophilus influenzae NOT DETECTED NOT DETECTED Final   Neisseria meningitidis NOT DETECTED NOT DETECTED Final   Pseudomonas aeruginosa NOT DETECTED NOT DETECTED Final   Stenotrophomonas maltophilia NOT DETECTED NOT DETECTED Final   Candida albicans NOT DETECTED NOT DETECTED Final   Candida auris NOT DETECTED NOT DETECTED Final   Candida glabrata NOT DETECTED NOT DETECTED Final   Candida krusei NOT DETECTED NOT DETECTED Final   Candida parapsilosis NOT DETECTED NOT DETECTED Final   Candida tropicalis NOT DETECTED NOT DETECTED Final   Cryptococcus neoformans/gattii NOT DETECTED NOT DETECTED Final    Comment: Performed at [facility], [address], [place], [place] [address]  Respiratory Panel by PCR     Status: None   Collection Time: 09/30/20  6:14 PM  Result Value Ref Range Status   Adenovirus NOT DETECTED NOT DETECTED Final   Coronavirus 229E NOT DETECTED NOT DETECTED Final    Comment: (NOTE) The Coronavirus on the Respiratory Panel, DOES NOT test for the novel  Coronavirus (2019 nCoV)    Coronavirus HKU1 NOT DETECTED NOT DETECTED Final   Coronavirus NL63 NOT DETECTED NOT DETECTED Final   Coronavirus OC43 NOT DETECTED NOT DETECTED Final   Metapneumovirus NOT DETECTED NOT DETECTED Final   Rhinovirus / Enterovirus NOT DETECTED NOT DETECTED Final   Influenza A NOT DETECTED NOT DETECTED Final   Influenza B NOT DETECTED NOT DETECTED Final   Parainfluenza Virus 1 NOT DETECTED NOT DETECTED Final   Parainfluenza Virus 2 NOT DETECTED NOT DETECTED Final   Parainfluenza Virus 3 NOT DETECTED NOT DETECTED Final   Parainfluenza Virus 4 NOT DETECTED NOT DETECTED Final   Respiratory Syncytial Virus NOT DETECTED NOT DETECTED Final   Bordetella pertussis NOT DETECTED NOT DETECTED Final   Chlamydophila pneumoniae NOT DETECTED NOT DETECTED Final   Mycoplasma pneumoniae NOT DETECTED NOT DETECTED Final    Comment: Performed at [facility], [address], [place], [place] [address]      Radiology Studies: No results found.  Scheduled Meds: . vitamin C  500 mg Oral Daily  . baricitinib  4 mg Oral Daily  . cholecalciferol  1,000 Units Oral Daily  . enoxaparin (LOVENOX) injection  55 mg Subcutaneous Q24H  . methylPREDNISolone (SOLU-MEDROL) injection  60 mg Intravenous Q12H  . vitamin B-12  100 mcg Oral Daily  . zinc sulfate  220 mg Oral Daily   Continuous Infusions: . remdesivir 100 mg in NS 100 mL 100 mg (10/02/20 0936)     LOS: 4 days   Time spent: 35 minutes.  Tyrone Nineyan B Anayia Eugene, MD [facility] [email or web address] 10/03/2020, 8:37 AM

## 2020-10-04 ENCOUNTER — Inpatient Hospital Stay (HOSPITAL_COMMUNITY): Payer: BC Managed Care – PPO

## 2020-10-04 LAB — COMPREHENSIVE METABOLIC PANEL
ALT: 46 U/L — ABNORMAL HIGH (ref 0–44)
AST: 41 U/L (ref 15–41)
Albumin: 3.2 g/dL — ABNORMAL LOW (ref 3.5–5.0)
Alkaline Phosphatase: 52 U/L (ref 38–126)
Anion gap: 10 (ref 5–15)
BUN: 24 mg/dL — ABNORMAL HIGH (ref 6–20)
CO2: 27 mmol/L (ref 22–32)
Calcium: 8.7 mg/dL — ABNORMAL LOW (ref 8.9–10.3)
Chloride: 103 mmol/L (ref 98–111)
Creatinine, Ser: 0.57 mg/dL (ref 0.44–1.00)
GFR, Estimated: >60 mL/min (ref >60)
Glucose, Bld: 122 mg/dL — ABNORMAL HIGH (ref 70–99)
Potassium: 3.5 mmol/L (ref 3.5–5.1)
Sodium: 140 mmol/L (ref 135–145)
Total Bilirubin: 0.6 mg/dL (ref 0.3–1.2)
Total Protein: 6.9 g/dL (ref 6.5–8.1)

## 2020-10-04 LAB — CULTURE, BLOOD (ROUTINE X 2): Culture: NO GROWTH

## 2020-10-04 LAB — C-REACTIVE PROTEIN: CRP: 0.8 mg/dL (ref <1.0)

## 2020-10-04 LAB — D-DIMER, QUANTITATIVE: D-Dimer, Quant: 0.88 ug/mL-FEU — ABNORMAL HIGH (ref 0.00–0.50)

## 2020-10-04 MED ORDER — METHYLPREDNISOLONE SODIUM SUCC 125 MG IJ SOLR
40.0000 mg | Freq: Two times a day (BID) | INTRAMUSCULAR | Status: DC
  Administered 2020-10-04 – 2020-10-05 (×3): 40 mg via INTRAVENOUS
  Filled 2020-10-04 (×3): qty 2

## 2020-10-04 NOTE — Progress Notes (Addendum)
Pt gave daughter a debit/credit like card from pt's room door, Security, Production manager witness, noted. Charge Nurse notified and informed for it to be done this way, noted.

## 2020-10-04 NOTE — Plan of Care (Signed)
  Problem: Education: Goal: Knowledge of General Education information will improve Description: Including pain rating scale, medication(s)/side effects and non-pharmacologic comfort measures Outcome: Progressing   Problem: Health Behavior/Discharge Planning: Goal: Ability to manage health-related needs will improve Outcome: Progressing   Problem: Clinical Measurements: Goal: Ability to maintain clinical measurements within normal limits will improve Outcome: Progressing Goal: Will remain free from infection Outcome: Progressing Goal: Diagnostic test results will improve Outcome: Progressing Goal: Respiratory complications will improve Outcome: Progressing Goal: Cardiovascular complication will be avoided Outcome: Progressing   Problem: Clinical Measurements: Goal: Ability to maintain clinical measurements within normal limits will improve Outcome: Progressing Goal: Will remain free from infection Outcome: Progressing Goal: Diagnostic test results will improve Outcome: Progressing Goal: Respiratory complications will improve Outcome: Progressing Goal: Cardiovascular complication will be avoided Outcome: Progressing   Problem: Nutrition: Goal: Adequate nutrition will be maintained Outcome: Progressing   Problem: Coping: Goal: Level of anxiety will decrease Outcome: Progressing   Problem: Pain Managment: Goal: General experience of comfort will improve Outcome: Progressing   Problem: Safety: Goal: Ability to remain free from injury will improve Outcome: Progressing   Problem: Respiratory: Goal: Will maintain a patent airway Outcome: Progressing Goal: Complications related to the disease process, condition or treatment will be avoided or minimized Outcome: Progressing

## 2020-10-04 NOTE — Progress Notes (Signed)
Pt states that she doesn't want to use CPAP QHS.  Pt states that it makes her congested and she doesn't want a "set back".  Pt to notify RT if she wants to try CPAP at a later time.

## 2020-10-04 NOTE — Progress Notes (Addendum)
PROGRESS NOTE  Ashley Adams  MBE:[id number] DOB: 12-12-1970 DOA: 09/28/2020 PCP: Maryagnes Amos, FNP   Brief Narrative: Ashley Adams is a 49 y.o. female with a history of HTN, OSA, obesity who presented 11/28 with cough and fever, found to be hypoxic to 83% on room air requiring supplemental oxygen. SARS-CoV-2 PCR was positive and CXR revealed multifocal opacities. Remdesivir and steroids were started. She continues to require supplemental oxygen.   Assessment & Plan: Principal Problem:   Pneumonia due to COVID-19 virus Active Problems:   Acute hypoxemic respiratory failure (HCC)   Sepsis (HCC)   Hypokalemia   Transaminitis  Acute hypoxemic respiratory failure due to covid-19 pneumonia: SARS-CoV-2 PCR positive on 11/28.  - Remains hypoxic, but will attempt weaning.  - CXR this AM personally reviewed shows increased density of indistinct left base opacity, otherwise stable bilateral infiltrates.  - S/p remdesivir (11/29 - 12/3) - Continue steroids, begin taper. CRP has normalized - Continue baricitinib (started 11/29 in response to advancing hypoxia) - Encourage OOB, IS, FV, and awake proning if able. PT/OT consulted for assistance in mobilization. - Continue airborne, contact precautions for 21 days from positive testing. - Monitor CMP and inflammatory markers - Enoxaparin prophylactic dose.    OSA:  - Can use CPAP qHS to help recruitment  Blood culture contaminant: Staph hominis, capitis in 1 of 2 collections. PCT is negative. - Remains afebrile off of antibiotics.   LFT elevation: Mild, due to covid-19 most likely. Not currently a contraindication to therapies as above. - Continue monitoring.   Normocytic anemia: Chronic, no bleeding.  - Monitor intermittently  Incontinence: Related to stress/coughing and worsened by dyspnea on exertion limiting ability to get to [facility].  - Recommend frequent toileting. No red flags on exam or by history.  Obesity: Estimated  body mass index is 37.67 kg/m as calculated from the following:   Height as of this encounter: 5\' 7"  (1.702 m).   Weight as of this encounter: 109.1 kg.  DVT prophylaxis: Lovenox Code Status: Full Family Communication: Sister by phone daily, last 12/3. Did not answer today. Disposition Plan:  Status is: Inpatient  Remains inpatient appropriate because:Inpatient level of care appropriate due to severity of illness  Dispo: The patient is from: Home              Anticipated d/c is to: Home              Anticipated d/c date is: 2 days, once oxygen requirements become manageable at home.              Patient currently is not medically stable to d/c.  Consultants:   None  Procedures:   None  Antimicrobials:  Remdesivir 11/29 - 12/3  Ceftriaxone, azithromycin 11/30 - 12/1   Subjective: Getting up more often today, feels better in that dyspnea is moderate with exertion, limited to room only. No chest pain. Cough is stable, persistent, severe.   Objective: Vitals:   10/03/20 2028 10/04/20 0000 10/04/20 0300 10/04/20 0657  BP: 133/78   125/83  Pulse: 82   65  Resp: 18   20  Temp: (!) 97.5 F (36.4 C)   98.5 F (36.9 C)  TempSrc: Oral   Oral  SpO2: (!) 89% 96% 98% 96%  Weight:      Height:       No intake or output data in the 24 hours ending 10/04/20 1158 Filed Weights   09/29/20 0230  Weight: 109.1 kg  Gen: 49 y.o. female in no distress Pulm: Nonlabored, tachypneic with diminished breath sounds throughout. CV: Regular rate and rhythm. No murmur, rub, or gallop. No JVD, no dependent edema. GI: Abdomen soft, non-tender, non-distended, with normoactive bowel sounds.  Ext: Warm, no deformities Skin: No rashes, lesions or ulcers on visualized skin. Neuro: Alert and oriented. No focal neurological deficits. Psych: Judgement and insight appear fair. Mood euthymic & affect congruent. Behavior is appropriate.    Data Reviewed: I have personally reviewed following labs  and imaging studies  CBC: Recent Labs  Lab 09/28/20 2307 09/30/20 0545 10/01/20 0517  WBC 7.9 4.7 9.4  NEUTROABS 6.8 3.6 7.7  HGB 11.4* 10.9* 11.2*  HCT 35.4* 33.8* 36.5  MCV 89.2 89.4 93.1  PLT 230 248 328   Basic Metabolic Panel: Recent Labs  Lab 09/28/20 2307 09/29/20 0439 09/30/20 0545 10/01/20 0517 10/02/20 0719 10/03/20 0947  NA 139  --  139 139 138 140  K 3.2*  --  3.7 3.6 3.6 3.6  CL 99  --  108 105 104 103  CO2 27  --  GLUCOSE 141*  --  135* 140* 134* 116*  BUN 13  --  21* 26* 19 23*  CREATININE 0.70  --  0.63 0.73 0.48 0.63  CALCIUM 8.4*  --  8.3* 8.5* 8.4* 8.5*  MG  --  2.0  --   --   --   --    GFR: Estimated Creatinine Clearance: 107 mL/min (by C-G formula based on SCr of 0.63 mg/dL). Liver Function Tests: Recent Labs  Lab 09/28/20 2307 09/30/20 0545 10/01/20 0517 10/02/20 0719 10/03/20 0947  AST 89* 75* 76* 52* 45*  ALT 46* 50* 60* 54* 53*  ALKPHOS 49 41 45 46 49  BILITOT 0.5 0.3 0.4 0.4 0.5  PROT 6.9 6.3* 6.7 6.4* 6.6  ALBUMIN 3.2* 2.8* 2.9* 2.9* 3.0*   Urine analysis:    Component Value Date/Time   COLORURINE YELLOW 05/09/2012 0725   APPEARANCEUR HAZY (A) 05/09/2012 0725   LABSPEC 1.020 05/09/2012 0725   PHURINE 6.0 05/09/2012 0725   GLUCOSEU NEGATIVE 05/09/2012 0725   HGBUR NEGATIVE 05/09/2012 0725   HGBUR negative 06/27/2009 1006   BILIRUBINUR large (A) 02/01/2018 1552   KETONESUR small (15) (A) 02/01/2018 1552   KETONESUR NEGATIVE 05/09/2012 0725   PROTEINUR >=300 (A) 02/01/2018 1552   PROTEINUR NEGATIVE 05/09/2012 0725   UROBILINOGEN 2.0 (A) 02/01/2018 1552   UROBILINOGEN 0.2 05/09/2012 0725   NITRITE Positive (A) 02/01/2018 1552   NITRITE NEGATIVE 05/09/2012 0725   LEUKOCYTESUR Negative 02/01/2018 1552   Recent Results (from the past 240 hour(s))  Resp Panel by RT-PCR (Flu A&B, Covid) Nasopharyngeal Swab     Status: Abnormal   Collection Time: 09/28/20 10:43 PM   Specimen: Nasopharyngeal Swab;  Nasopharyngeal(NP) swabs in vial transport medium  Result Value Ref Range Status   SARS Coronavirus 2 by RT PCR POSITIVE (A) NEGATIVE Final    Comment: RESULT CALLED TO, READ BACK BY AND VERIFIED WITH: PETRUCELLI RN/NP @ 00:57 09/29/20 BY BOWMAN,K  (NOTE) SARS-CoV-2 target nucleic acids are DETECTED.  The SARS-CoV-2 RNA is generally detectable in upper respiratory specimens during the acute phase of infection. Positive results are indicative of the presence of the identified virus, but do not rule out bacterial infection or co-infection with other pathogens not detected by the test. Clinical correlation with patient history and other diagnostic information is necessary to determine patient infection status. The expected result  is Negative.  Fact Sheet for Patients: BloggerCourse.com  Fact Sheet for Healthcare Providers: SeriousBroker.it  This test is not yet approved or cleared by the [place] FDA and  has been authorized for detection and/or diagnosis of SARS-CoV-2 by FDA under an Emergency Use Authorization (EUA).  This EUA will remain in effect (meaning thi s test can be used) for the duration of  the COVID-19 declaration under Section 564(b)(1) of the Act, 21 U.S.C. section 360bbb-3(b)(1), unless the authorization is terminated or revoked sooner.     Influenza A by PCR NEGATIVE NEGATIVE Final   Influenza B by PCR NEGATIVE NEGATIVE Final    Comment: (NOTE) The Xpert Xpress SARS-CoV-2/FLU/RSV plus assay is intended as an aid in the diagnosis of influenza from Nasopharyngeal swab specimens and should not be used as a sole basis for treatment. Nasal washings and aspirates are unacceptable for Xpert Xpress SARS-CoV-2/FLU/RSV testing.  Fact Sheet for Patients: BloggerCourse.com  Fact Sheet for Healthcare Providers: SeriousBroker.it  This test is not yet approved or  cleared by the [place] FDA and has been authorized for detection and/or diagnosis of SARS-CoV-2 by FDA under an Emergency Use Authorization (EUA). This EUA will remain in effect (meaning this test can be used) for the duration of the COVID-19 declaration under Section 564(b)(1) of the Act, 21 U.S.C. section 360bbb-3(b)(1), unless the authorization is terminated or revoked.  Performed at [facility], [address] [place], [place] [address]   Blood Culture (routine x 2)     Status: None   Collection Time: 09/28/20 11:07 PM   Specimen: BLOOD  Result Value Ref Range Status   Specimen Description   Final    BLOOD BLOOD RIGHT HAND Performed at [facility], [address], [place], [place] [address]    Special Requests   Final    BOTTLES DRAWN AEROBIC AND ANAEROBIC Blood Culture results may not be optimal due to an excessive volume of blood received in culture bottles Performed at [facility], [address], [place], [place] [address]    Culture   Final    NO GROWTH 5 DAYS Performed at [facility], [address], [place], [place] [address]    Report Status 10/04/2020 FINAL  Final  Blood Culture (routine x 2)     Status: Abnormal   Collection Time: 09/28/20 11:07 PM   Specimen: BLOOD  Result Value Ref Range Status   Specimen Description   Final    BLOOD LEFT ANTECUBITAL Performed at [facility], [address], [place], [place] [address]    Special Requests   Final    BOTTLES DRAWN AEROBIC AND ANAEROBIC Blood Culture results may not be optimal due to an excessive volume of blood received in culture bottles Performed at [facility], [address], [place], [place] [address]    Culture  Setup Time   Final    GRAM POSITIVE COCCI IN CLUSTERS IN BOTH AEROBIC AND ANAEROBIC BOTTLES Organism ID to follow CRITICAL RESULT CALLED TO, READ BACK BY AND VERIFIED WITH: A PHAM  PHARMD [id number] 09/29/20 A BROWNING    Culture (A)  Final    STAPHYLOCOCCUS HOMINIS STAPHYLOCOCCUS CAPITIS THE SIGNIFICANCE OF ISOLATING THIS ORGANISM FROM A SINGLE SET OF BLOOD CULTURES WHEN MULTIPLE SETS ARE DRAWN IS UNCERTAIN. PLEASE NOTIFY THE MICROBIOLOGY DEPARTMENT WITHIN ONE WEEK IF SPECIATION AND SENSITIVITIES ARE REQUIRED. Performed at [facility], [address], [place], [place] [address]  Report Status 10/01/2020 FINAL  Final  Blood Culture ID Panel (Reflexed)     Status: Abnormal   Collection Time: 09/28/20 11:07 PM  Result Value Ref Range Status   Enterococcus faecalis NOT DETECTED NOT DETECTED Final   Enterococcus Faecium NOT DETECTED NOT DETECTED Final   Listeria monocytogenes NOT DETECTED NOT DETECTED Final   Staphylococcus species DETECTED (A) NOT DETECTED Final    Comment: CRITICAL RESULT CALLED TO, READ BACK BY AND VERIFIED WITH: A PHAM PHARMD [id number] 09/29/20 A BROWNING    Staphylococcus aureus (BCID) NOT DETECTED NOT DETECTED Final   Staphylococcus epidermidis NOT DETECTED NOT DETECTED Final   Staphylococcus lugdunensis NOT DETECTED NOT DETECTED Final   Streptococcus species NOT DETECTED NOT DETECTED Final   Streptococcus agalactiae NOT DETECTED NOT DETECTED Final   Streptococcus pneumoniae NOT DETECTED NOT DETECTED Final   Streptococcus pyogenes NOT DETECTED NOT DETECTED Final   A.calcoaceticus-baumannii NOT DETECTED NOT DETECTED Final   Bacteroides fragilis NOT DETECTED NOT DETECTED Final   Enterobacterales NOT DETECTED NOT DETECTED Final   Enterobacter cloacae complex NOT DETECTED NOT DETECTED Final   Escherichia coli NOT DETECTED NOT DETECTED Final   Klebsiella aerogenes NOT DETECTED NOT DETECTED Final   Klebsiella oxytoca NOT DETECTED NOT DETECTED Final   Klebsiella pneumoniae NOT DETECTED NOT DETECTED Final   Proteus species NOT DETECTED NOT DETECTED Final   Salmonella species NOT DETECTED NOT DETECTED Final   Serratia marcescens NOT DETECTED NOT  DETECTED Final   Haemophilus influenzae NOT DETECTED NOT DETECTED Final   Neisseria meningitidis NOT DETECTED NOT DETECTED Final   Pseudomonas aeruginosa NOT DETECTED NOT DETECTED Final   Stenotrophomonas maltophilia NOT DETECTED NOT DETECTED Final   Candida albicans NOT DETECTED NOT DETECTED Final   Candida auris NOT DETECTED NOT DETECTED Final   Candida glabrata NOT DETECTED NOT DETECTED Final   Candida krusei NOT DETECTED NOT DETECTED Final   Candida parapsilosis NOT DETECTED NOT DETECTED Final   Candida tropicalis NOT DETECTED NOT DETECTED Final   Cryptococcus neoformans/gattii NOT DETECTED NOT DETECTED Final    Comment: Performed at [facility], [address], [place], [place] [address]  Respiratory Panel by PCR     Status: None   Collection Time: 09/30/20  6:14 PM  Result Value Ref Range Status   Adenovirus NOT DETECTED NOT DETECTED Final   Coronavirus 229E NOT DETECTED NOT DETECTED Final    Comment: (NOTE) The Coronavirus on the Respiratory Panel, DOES NOT test for the novel  Coronavirus (2019 nCoV)    Coronavirus HKU1 NOT DETECTED NOT DETECTED Final   Coronavirus NL63 NOT DETECTED NOT DETECTED Final   Coronavirus OC43 NOT DETECTED NOT DETECTED Final   Metapneumovirus NOT DETECTED NOT DETECTED Final   Rhinovirus / Enterovirus NOT DETECTED NOT DETECTED Final   Influenza A NOT DETECTED NOT DETECTED Final   Influenza B NOT DETECTED NOT DETECTED Final   Parainfluenza Virus 1 NOT DETECTED NOT DETECTED Final   Parainfluenza Virus 2 NOT DETECTED NOT DETECTED Final   Parainfluenza Virus 3 NOT DETECTED NOT DETECTED Final   Parainfluenza Virus 4 NOT DETECTED NOT DETECTED Final   Respiratory Syncytial Virus NOT DETECTED NOT DETECTED Final   Bordetella pertussis NOT DETECTED NOT DETECTED Final   Chlamydophila pneumoniae NOT DETECTED NOT DETECTED Final   Mycoplasma pneumoniae NOT DETECTED NOT DETECTED Final    Comment: Performed at [facility], [address],  [place], [place] [address]      Radiology Studies: DG CHEST PORT  1 VIEW  Result Date: 10/04/2020 CLINICAL DATA:  Acute hypoxic respiratory failure due to COVID-19. EXAM: PORTABLE CHEST 1 VIEW COMPARISON:  Chest x-ray dated 09/28/2020. FINDINGS: Patchy bibasilar airspace opacities, LEFT greater than RIGHT, at least mildly worsened within the LEFT lower lung compared to the previous chest x-ray. No pneumothorax is seen. Heart size and mediastinal contours are stable. IMPRESSION: Bibasilar pneumonia, worsened on the LEFT compared to chest x-ray of 09/28/2020. Electronically Signed   By: Bary RichardStan  Maynard M.D.   On: 10/04/2020 05:49    Scheduled Meds: . vitamin C  500 mg Oral Daily  . baricitinib  4 mg Oral Daily  . cholecalciferol  1,000 Units Oral Daily  . enoxaparin (LOVENOX) injection  55 mg Subcutaneous Q24H  . methylPREDNISolone (SOLU-MEDROL) injection  60 mg Intravenous Q12H  . vitamin B-12  100 mcg Oral Daily  . zinc sulfate  220 mg Oral Daily   Continuous Infusions:    LOS: 5 days   Time spent: 35 minutes.  Tyrone Nineyan B Emiley Digiacomo, MD [facility] [email or web address] 10/04/2020, 11:58 AM

## 2020-10-04 NOTE — Evaluation (Signed)
Occupational Therapy Evaluation Patient Details Name: Ashley Adams MRN: [id number] DOB: 03-Jul-1971 Today's Date: 10/04/2020    History of Present Illness Ashley Adams is a 49 y.o. female with a history of HTN, OSA, obesity who presented 11/28 found to have SARS-CoV-2 PCR and CXR revealed multifocal opacities. Patient admitted for COVID pna and requiring supplemental oxygen.   Clinical Impression   Ashley Adams is a 49 year old woman normally independent who lives alone and works two jobs who is now admitted in hospital with COVID pneumonia. On evaluation she demonstrates normal upper body strength but decreased activity tolerance and impaired cardiopulmonary endurance resulting in a decline in ability to perform normal daily activities. Patient supervision for all ADLs and ambulation in room with one mild loss of balance. Patient currently on 13 L HFNC and o2 sat dropped with ambulation - down to 79% after 60 feet. Patient educated on use of breathing techniques, breathing devices, exercise and mobility for recovery. Patient verbalized understanding. Patient will benefit from skilled OT services to improve activity tolerance and cardiopulmonary status  to reduce oxygenation needs in order for patient to return home at discharge. Recommend shower chair for home at discharge. Patient doesn't report significant fatigue or shortness of breath with activity and does well with breathing techniques. Patient's main concern was incontinence with coughing.      Follow Up Recommendations  No OT follow up    Equipment Recommendations  Tub/shower seat    Recommendations for Other Services       Precautions / Restrictions Precautions Precautions: None Restrictions Weight Bearing Restrictions: No      Mobility Bed Mobility               General bed mobility comments: up in chair    Transfers Overall transfer level: Needs assistance   Transfers: Sit to/from Stand;Stand Pivot  Transfers Sit to Stand: Supervision Stand pivot transfers: Supervision       General transfer comment: Supervision to ambulate in room on 13 L HFNC (extended tubing). Ambulated 30 feet and o2 sat dropped to 86% but recovered quickly with verbal cues for breathing technique. Ambulated 60 feet and o2 sat dropped to 79% but also recovered within one minute.    Balance Overall balance assessment: Mild deficits observed, not formally tested                                         ADL either performed or assessed with clinical judgement   ADL Overall ADL's : Needs assistance/impaired Eating/Feeding: Independent   Grooming: Independent   Upper Body Bathing: Set up   Lower Body Bathing: Set up   Upper Body Dressing : Set up   Lower Body Dressing: Set up Lower Body Dressing Details (indicate cue type and reason): able to don socks Toilet Transfer: Supervision/safety;Ambulation;Grab bars   Toileting- Clothing Manipulation and Hygiene: Supervision/safety;Sit to/from stand       Functional mobility during ADLs: Supervision/safety       Vision         Perception     Praxis      Pertinent Vitals/Pain Pain Assessment: No/denies pain     Hand Dominance Right   Extremity/Trunk Assessment Upper Extremity Assessment Upper Extremity Assessment: Overall WFL for tasks assessed;RUE deficits/detail;LUE deficits/detail RUE Deficits / Details: 5/5 strength throughout RUE Sensation: WNL RUE Coordination: WNL LUE Deficits / Details: 5/5 strength  throughout LUE: Unable to fully assess due to pain LUE Sensation: WNL   Lower Extremity Assessment Lower Extremity Assessment: Defer to PT evaluation   Cervical / Trunk Assessment Cervical / Trunk Assessment: Normal   Communication Communication Communication: No difficulties   Cognition Arousal/Alertness: Awake/alert Behavior During Therapy: WFL for tasks assessed/performed Overall Cognitive Status: Within  Functional Limits for tasks assessed                                     General Comments  Initially ambulated very slowly with one loss of balance that patient corrected by holding onto bed foot board. With verbal cue to increase gait speed - balance improved.    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        OT Problem List: Decreased activity tolerance;Cardiopulmonary status limiting activity      OT Treatment/Interventions: Self-care/ADL training;Therapeutic exercise;Energy conservation;DME and/or AE instruction;Therapeutic activities;Balance training;Patient/family education    OT Goals(Current goals can be found in the care plan section) Acute Rehab OT Goals Patient Stated Goal: return to work OT Goal Formulation: All assessment and education complete, DC therapy Time For Goal Achievement: 10/18/20 Potential to Achieve Goals: Good  OT Frequency:     Barriers to D/C:            Co-evaluation              AM-PAC OT "6 Clicks" Daily Activity     Outcome Measure Help from another person eating meals?: None Help from another person taking care of personal grooming?: None Help from another person toileting, which includes using toliet, bedpan, or urinal?: None Help from another person bathing (including washing, rinsing, drying)?: None Help from another person to put on and taking off regular upper body clothing?: None Help from another person to put on  and taking off regular lower body clothing?: None 6 Click Score: 24   End of Session Nurse Communication: Mobility status  Activity Tolerance: Patient tolerated treatment well Patient left: in chair;with call bell/phone within reach  OT Visit Diagnosis: Muscle weakness (generalized) (M62.81);Unsteadiness on feet (R26.81)                Time: 4268-3419 OT Time Calculation (min): 23 min Charges:  OT General Charges $OT Visit: 1 Visit OT Evaluation $OT Eval Low Complexity: 1 Low OT Treatments $Therapeutic Activity: 8-22 mins  Vonda, OTR/L Acute Care Rehab Services  Office [phone] Pager: [phone]   Kelli Churn 10/04/2020, 3:04 PM

## 2020-10-05 NOTE — Progress Notes (Signed)
[facility] PROGRESS NOTE    Progress Note  Ashley Adams  CXK:[id number] DOB: [birth year] DOA: 09/28/2020 PCP: Maryagnes Amos, FNP     Brief Narrative:   Ashley Adams is an 49 y.o. female with a history of hypertension obstructive sleep apnea came in on 09/28/2020 found to be hypoxic chest x-ray showing bilateral infiltrates and SARS-CoV-2 positive she was started empirically on IV remdesivir and steroids.  She was also placed on supplemental oxygen.  Assessment/Plan:   Acute respiratory failure with hypoxia due to Pneumonia due to COVID-19 virus Ashley Adams is requiring 2 L of high flow nasal cannula to keep saturations greater than 93%. Complete her course of IV remdesivir. We are currently tapering down her steroids.  She is currently on baricitinib which will continue for total of 14 days. Encourage out of bed to chair, incentive spirometry and flutter valve try to keep the patient prone for Lee 16 hours a day. Physical therapy has been consulted.  Obstructive sleep apnea: Continue CPAP.  1 out of 2 blood cultures positive for staph minimus: Procalcitonin low yield she has remained afebrile off antibiotics likely a contaminant.  Transaminitis: Mild likely due to COVID-19.  Normocytic anemia: No signs of bleeding continue to monitor intermittently.  Incontinence stress related: Recommend frequent toileting.  Obesity with an estimated body mass index of 37.6 kg m2.    DVT prophylaxis: lovenox Family Communication:none Status is: Inpatient  Remains inpatient appropriate because:Hemodynamically unstable   Dispo: The patient is from: Home              Anticipated d/c is to: Home              Anticipated d/c date is: > 3 days              Patient currently is not medically stable to d/c.        Code Status:     Code Status Orders  (From admission, onward)         Start     Ordered   09/29/20 0151  Full code  Continuous         09/29/20 0154        Code Status History    This patient has a current code status but no historical code status.   Advance Care Planning Activity        IV Access:    Peripheral IV   Procedures and diagnostic studies:   DG CHEST PORT 1 VIEW  Result Date: 10/04/2020 CLINICAL DATA:  Acute hypoxic respiratory failure due to COVID-19. EXAM: PORTABLE CHEST 1 VIEW COMPARISON:  Chest x-ray dated 09/28/2020. FINDINGS: Patchy bibasilar airspace opacities, LEFT greater than RIGHT, at least mildly worsened within the LEFT lower lung compared to the previous chest x-ray. No pneumothorax is seen. Heart size and mediastinal contours are stable. IMPRESSION: Bibasilar pneumonia, worsened on the LEFT compared to chest x-ray of 09/28/2020. Electronically Signed   By: Bary Richard M.D.   On: 10/04/2020 05:49     Medical Consultants:    None.  Anti-Infectives:   remdesivir  Subjective:    Ashley Adams ralatess his breathing is unchanged compared to yesterday.  Objective:    Vitals:   10/04/20 0657 10/04/20 1252 10/04/20 2107 10/05/20 0549  BP: 125/83 124/78 124/81 114/68  Pulse: 65 71 70 (!) 55  Resp: 20 17 18 20   Temp: 98.5 F (36.9 C) 97.8 F (36.6 C) (!) 97.5 F (36.4 C) 98.6 F (  37 C)  TempSrc: Oral  Oral Oral  SpO2: 96% 93% 96% 93%  Weight:      Height:       SpO2: 93 % O2 Flow Rate (L/min): 10 L/min  No intake or output data in the 24 hours ending 10/05/20 0809 Filed Weights   09/29/20 0230  Weight: 109.1 kg    Exam: General exam: In no acute distress. Respiratory system: Good air movement and clear to auscultation. Cardiovascular system: S1 & S2 heard, RRR.  Gastrointestinal system: Abdomen is nondistended, soft and nontender.  Extremities: No pedal edema. Skin: No rashes, lesions or ulcers Psychiatry: Judgement and insight appear normal. Mood & affect appropriate.    Data Reviewed:    Labs: Basic Metabolic Panel: Recent Labs  Lab  09/28/20 2307 09/29/20 0439 09/30/20 0545 09/30/20 0545 10/01/20 0517 10/01/20 0517 10/02/20 0719 10/02/20 0719 10/03/20 0947 10/04/20 1059  NA   < >  --  139  --  139  --  138  --  140 140  K   < >  --  3.7   < > 3.6   < > 3.6   < > 3.6 3.5  CL   < >  --  108  --  105  --  104  --  103 103  CO2   < >  --  22  --  24  --  24  --  27 27  GLUCOSE   < >  --  135*  --  140*  --  134*  --  116* 122*  BUN   < >  --  21*  --  26*  --  19  --  23* 24*  CREATININE   < >  --  0.63  --  0.73  --  0.48  --  0.63 0.57  CALCIUM   < >  --  8.3*  --  8.5*  --  8.4*  --  8.5* 8.7*  MG  --  2.0  --   --   --   --   --   --   --   --    < > = values in this interval not displayed.   GFR Estimated Creatinine Clearance: 107 mL/min (by C-G formula based on SCr of 0.57 mg/dL). Liver Function Tests: Recent Labs  Lab 09/30/20 0545 10/01/20 0517 10/02/20 0719 10/03/20 0947 10/04/20 1059  AST 75* 76* 52* 45* 41  ALT 50* 60* 54* 53* 46*  ALKPHOS 41 45 46 49 52  BILITOT 0.3 0.4 0.4 0.5 0.6  PROT 6.3* 6.7 6.4* 6.6 6.9  ALBUMIN 2.8* 2.9* 2.9* 3.0* 3.2*   No results for input(s): LIPASE, AMYLASE in the last 168 hours. No results for input(s): AMMONIA in the last 168 hours. Coagulation profile No results for input(s): INR, PROTIME in the last 168 hours. COVID-19 Labs  Recent Labs    10/03/20 0947 10/04/20 1059  DDIMER 1.02* 0.88*  CRP 0.8 0.8    Lab Results  Component Value Date   SARSCOV2NAA POSITIVE (A) 09/28/2020    CBC: Recent Labs  Lab 09/28/20 2307 09/30/20 0545 10/01/20 0517  WBC 7.9 4.7 9.4  NEUTROABS 6.8 3.6 7.7  HGB 11.4* 10.9* 11.2*  HCT 35.4* 33.8* 36.5  MCV 89.2 89.4 93.1  PLT 230 248 328   Cardiac Enzymes: No results for input(s): CKTOTAL, CKMB, CKMBINDEX, TROPONINI in the last 168 hours. BNP (last 3 results) No results for input(s): PROBNP in the  last 8760 hours. CBG: No results for input(s): GLUCAP in the last 168 hours. D-Dimer: Recent Labs     10/03/20 0947 10/04/20 1059  DDIMER 1.02* 0.88*   Hgb A1c: No results for input(s): HGBA1C in the last 72 hours. Lipid Profile: No results for input(s): CHOL, HDL, LDLCALC, TRIG, CHOLHDL, LDLDIRECT in the last 72 hours. Thyroid function studies: No results for input(s): TSH, T4TOTAL, T3FREE, THYROIDAB in the last 72 hours.  Invalid input(s): FREET3 Anemia work up: No results for input(s): VITAMINB12, FOLATE, FERRITIN, TIBC, IRON, RETICCTPCT in the last 72 hours. Sepsis Labs: Recent Labs  Lab 09/28/20 2307 09/29/20 0439 09/30/20 0545 09/30/20 1049 10/01/20 0517  PROCALCITON 0.26  --   --  <0.10  --   WBC 7.9  --  4.7  --  9.4  LATICACIDVEN 1.3 1.2  --   --   --    Microbiology Recent Results (from the past 240 hour(s))  Resp Panel by RT-PCR (Flu A&B, Covid) Nasopharyngeal Swab     Status: Abnormal   Collection Time: 09/28/20 10:43 PM   Specimen: Nasopharyngeal Swab; Nasopharyngeal(NP) swabs in vial transport medium  Result Value Ref Range Status   SARS Coronavirus 2 by RT PCR POSITIVE (A) NEGATIVE Final    Comment: RESULT CALLED TO, READ BACK BY AND VERIFIED WITH: PETRUCELLI RN/NP @ 00:57 09/29/20 BY BOWMAN,K  (NOTE) SARS-CoV-2 target nucleic acids are DETECTED.  The SARS-CoV-2 RNA is generally detectable in upper respiratory specimens during the acute phase of infection. Positive results are indicative of the presence of the identified virus, but do not rule out bacterial infection or co-infection with other pathogens not detected by the test. Clinical correlation with patient history and other diagnostic information is necessary to determine patient infection status. The expected result is Negative.  Fact Sheet for Patients: BloggerCourse.comhttps://www.fda.gov/media/152166/download  Fact Sheet for Healthcare Providers: SeriousBroker.ithttps://www.fda.gov/media/152162/download  This test is not yet approved or cleared by the [place] FDA and  has been authorized for detection and/or  diagnosis of SARS-CoV-2 by FDA under an Emergency Use Authorization (EUA).  This EUA will remain in effect (meaning thi s test can be used) for the duration of  the COVID-19 declaration under Section 564(b)(1) of the Act, 21 U.S.C. section 360bbb-3(b)(1), unless the authorization is terminated or revoked sooner.     Influenza A by PCR NEGATIVE NEGATIVE Final   Influenza B by PCR NEGATIVE NEGATIVE Final    Comment: (NOTE) The Xpert Xpress SARS-CoV-2/FLU/RSV plus assay is intended as an aid in the diagnosis of influenza from Nasopharyngeal swab specimens and should not be used as a sole basis for treatment. Nasal washings and aspirates are unacceptable for Xpert Xpress SARS-CoV-2/FLU/RSV testing.  Fact Sheet for Patients: BloggerCourse.comhttps://www.fda.gov/media/152166/download  Fact Sheet for Healthcare Providers: SeriousBroker.ithttps://www.fda.gov/media/152162/download  This test is not yet approved or cleared by the [place] FDA and has been authorized for detection and/or diagnosis of SARS-CoV-2 by FDA under an Emergency Use Authorization (EUA). This EUA will remain in effect (meaning this test can be used) for the duration of the COVID-19 declaration under Section 564(b)(1) of the Act, 21 U.S.C. section 360bbb-3(b)(1), unless the authorization is terminated or revoked.  Performed at [facility], [address] [place], [place] [id number]   Blood Culture (routine x 2)     Status: None   Collection Time: 09/28/20 11:07 PM   Specimen: BLOOD  Result Value Ref Range Status   Specimen Description   Final    BLOOD BLOOD RIGHT HAND  Performed at [facility], [address] [place], [place], [place] [address]    Special Requests   Final    BOTTLES DRAWN AEROBIC AND ANAEROBIC Blood Culture results may not be optimal due to an excessive volume of blood received in culture bottles Performed at [facility], [address] [place], [place] [address]     Culture   Final    NO GROWTH 5 DAYS Performed at [facility], [address], [place], [place] [address]    Report Status 10/04/2020 FINAL  Final  Blood Culture (routine x 2)     Status: Abnormal   Collection Time: 09/28/20 11:07 PM   Specimen: BLOOD  Result Value Ref Range Status   Specimen Description   Final    BLOOD LEFT ANTECUBITAL Performed at [facility], [address] [place], [place] [address]    Special Requests   Final    BOTTLES DRAWN AEROBIC AND ANAEROBIC Blood Culture results may not be optimal due to an excessive volume of blood received in culture bottles Performed at [facility], [address], [place], [place] [address]    Culture  Setup Time   Final    GRAM POSITIVE COCCI IN CLUSTERS IN BOTH AEROBIC AND ANAEROBIC BOTTLES Organism ID to follow CRITICAL RESULT CALLED TO, READ BACK BY AND VERIFIED WITH: A PHAM PHARMD [id number] 09/29/20 A BROWNING    Culture (A)  Final    STAPHYLOCOCCUS HOMINIS STAPHYLOCOCCUS CAPITIS THE SIGNIFICANCE OF ISOLATING THIS ORGANISM FROM A SINGLE SET OF BLOOD CULTURES WHEN MULTIPLE SETS ARE DRAWN IS UNCERTAIN. PLEASE NOTIFY THE MICROBIOLOGY DEPARTMENT WITHIN ONE WEEK IF SPECIATION AND SENSITIVITIES ARE REQUIRED. Performed at [facility], [address], [place], [place] [address]    Report Status 10/01/2020 FINAL  Final  Blood Culture ID Panel (Reflexed)     Status: Abnormal   Collection Time: 09/28/20 11:07 PM  Result Value Ref Range Status   Enterococcus faecalis NOT DETECTED NOT DETECTED Final   Enterococcus Faecium NOT DETECTED NOT DETECTED Final   Listeria monocytogenes NOT DETECTED NOT DETECTED Final   Staphylococcus species DETECTED (A) NOT DETECTED Final    Comment: CRITICAL RESULT CALLED TO, READ BACK BY AND VERIFIED WITH: A PHAM PHARMD [id number] 09/29/20 A BROWNING    Staphylococcus aureus (BCID) NOT DETECTED NOT DETECTED Final   Staphylococcus epidermidis NOT DETECTED NOT  DETECTED Final   Staphylococcus lugdunensis NOT DETECTED NOT DETECTED Final   Streptococcus species NOT DETECTED NOT DETECTED Final   Streptococcus agalactiae NOT DETECTED NOT DETECTED Final   Streptococcus pneumoniae NOT DETECTED NOT DETECTED Final   Streptococcus pyogenes NOT DETECTED NOT DETECTED Final   A.calcoaceticus-baumannii NOT DETECTED NOT DETECTED Final   Bacteroides fragilis NOT DETECTED NOT DETECTED Final   Enterobacterales NOT DETECTED NOT DETECTED Final   Enterobacter cloacae complex NOT DETECTED NOT DETECTED Final   Escherichia coli NOT DETECTED NOT DETECTED Final   Klebsiella aerogenes NOT DETECTED NOT DETECTED Final   Klebsiella oxytoca NOT DETECTED NOT DETECTED Final   Klebsiella pneumoniae NOT DETECTED NOT DETECTED Final   Proteus species NOT DETECTED NOT DETECTED Final   Salmonella species NOT DETECTED NOT DETECTED Final   Serratia marcescens NOT DETECTED NOT DETECTED Final   Haemophilus influenzae NOT DETECTED NOT DETECTED Final   Neisseria meningitidis NOT DETECTED NOT DETECTED Final   Pseudomonas aeruginosa NOT DETECTED NOT DETECTED Final   Stenotrophomonas maltophilia NOT DETECTED NOT DETECTED Final   Candida albicans  NOT DETECTED NOT DETECTED Final   Candida auris NOT DETECTED NOT DETECTED Final   Candida glabrata NOT DETECTED NOT DETECTED Final   Candida krusei NOT DETECTED NOT DETECTED Final   Candida parapsilosis NOT DETECTED NOT DETECTED Final   Candida tropicalis NOT DETECTED NOT DETECTED Final   Cryptococcus neoformans/gattii NOT DETECTED NOT DETECTED Final    Comment: Performed at [facility], [address], [place], [place] [address]  Respiratory Panel by PCR     Status: None   Collection Time: 09/30/20  6:14 PM  Result Value Ref Range Status   Adenovirus NOT DETECTED NOT DETECTED Final   Coronavirus 229E NOT DETECTED NOT DETECTED Final    Comment: (NOTE) The Coronavirus on the Respiratory Panel, DOES NOT test for the novel  Coronavirus  (2019 nCoV)    Coronavirus HKU1 NOT DETECTED NOT DETECTED Final   Coronavirus NL63 NOT DETECTED NOT DETECTED Final   Coronavirus OC43 NOT DETECTED NOT DETECTED Final   Metapneumovirus NOT DETECTED NOT DETECTED Final   Rhinovirus / Enterovirus NOT DETECTED NOT DETECTED Final   Influenza A NOT DETECTED NOT DETECTED Final   Influenza B NOT DETECTED NOT DETECTED Final   Parainfluenza Virus 1 NOT DETECTED NOT DETECTED Final   Parainfluenza Virus 2 NOT DETECTED NOT DETECTED Final   Parainfluenza Virus 3 NOT DETECTED NOT DETECTED Final   Parainfluenza Virus 4 NOT DETECTED NOT DETECTED Final   Respiratory Syncytial Virus NOT DETECTED NOT DETECTED Final   Bordetella pertussis NOT DETECTED NOT DETECTED Final   Chlamydophila pneumoniae NOT DETECTED NOT DETECTED Final   Mycoplasma pneumoniae NOT DETECTED NOT DETECTED Final    Comment: Performed at [facility], [address], [place], [place] [address]     Medications:   . vitamin C  500 mg Oral Daily  . baricitinib  4 mg Oral Daily  . cholecalciferol  1,000 Units Oral Daily  . enoxaparin (LOVENOX) injection  55 mg Subcutaneous Q24H  . methylPREDNISolone (SOLU-MEDROL) injection  40 mg Intravenous Q12H  . vitamin B-12  100 mcg Oral Daily  . zinc sulfate  220 mg Oral Daily   Continuous Infusions:    LOS: 6 days   Marinda Elk  [facility]  10/05/2020, 8:09 AM

## 2020-10-05 NOTE — Evaluation (Signed)
Physical Therapy Evaluation Patient Details Name: Ashley Adams MRN: [id number] DOB: [birth year] Today's Date: 10/05/2020   History of Present Illness  Ashley Adams is a 49 y.o. female with a history of HTN, OSA, obesity who presented 11/28 found to have SARS-CoV-2 PCR and CXR revealed multifocal opacities. Patient admitted for COVID pna and requiring supplemental oxygen.  Clinical Impression  Pt admitted with above diagnosis.  Pt currently with functional limitations due to the deficits listed below (see PT Problem List). Pt will benefit from skilled PT to increase their independence and safety with mobility to allow discharge to the venue listed below.  Pt instructed in LE therex she can perform throughout the day. De-sat on 10 L/min with gait in room. Will follow acutely, but do not feel she will need any PT follow up at time of d/c.     Follow Up Recommendations No PT follow up    Equipment Recommendations  None recommended by PT    Recommendations for Other Services       Precautions / Restrictions Precautions Precautions: Other (comment) Precaution Comments: COVID Restrictions Weight Bearing Restrictions: No      Mobility  Bed Mobility Overal bed mobility: Independent                  Transfers Overall transfer level: Independent                  Ambulation/Gait Ambulation/Gait assistance: Supervision;Min guard Gait Distance (Feet): 60 Feet Assistive device: None Gait Pattern/deviations: Decreased step length - right;Decreased step length - left     General Gait Details: Ambulated in room on 10L/min with o2 dropping briefly into upper 70s.  Returned to upper 80s then low 90s within 90 seconds once sitting EOB. S at times for lines. Started out a little slower and slightly guarded with MIN/guard, but as gait progressed steadiness improved.  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     Hand Dominance   Dominant Hand: Right    Extremity/Trunk Assessment   Upper Extremity Assessment Upper Extremity Assessment: Defer to OT evaluation    Lower Extremity Assessment Lower Extremity Assessment: Overall WFL for tasks assessed    Cervical / Trunk Assessment Cervical / Trunk Assessment: Normal  Communication   Communication: No difficulties  Cognition Arousal/Alertness: Awake/alert Behavior During Therapy: WFL for tasks assessed/performed Overall Cognitive Status: Within Functional Limits for tasks assessed  General Comments General comments (skin integrity, edema, etc.): Education on room safety when she gets up and goes to the bathroom. Pt instructed in seated LE exercises she can do when up in the chair, including marching, kicks, hip abd, glute sqeezes, and ankle pumps.  Discussion regarding return to work at her 2 jobs. Deferred some questions to her MD.    Exercises General Exercises - Lower Extremity Ankle Circles/Pumps: AROM;Both;10 reps Long Arc Quad: AROM;Both;5 reps Hip ABduction/ADduction: AROM;Both;5 reps   Assessment/Plan    PT Assessment Patient needs continued PT services  PT Problem List Decreased activity tolerance;Decreased balance;Decreased mobility;Cardiopulmonary status limiting activity       PT  Treatment Interventions Gait training;Functional mobility training;Balance training;Therapeutic exercise;Therapeutic activities    PT Goals (Current goals can be found in the Care Plan section)  Acute Rehab PT Goals Patient Stated Goal: return to work PT Goal Formulation: With patient Time For Goal Achievement: 10/19/20 Potential to Achieve Goals: Good    Frequency Min 3X/week   Barriers to discharge        Co-evaluation               AM-PAC PT "6 Clicks" Mobility  Outcome Measure Help needed turning from your back to your side while in a flat bed without using bedrails?: None Help needed moving from lying on your back to sitting on the side of a flat bed without using bedrails?: None Help needed moving to and from a bed to a chair (including a wheelchair)?: A Little Help needed standing up from a chair using your arms (e.g., wheelchair or bedside chair)?: A Little Help needed to walk in hospital room?: A Little Help needed climbing 3-5 steps with a railing? : A Little 6 Click Score: 20    End of Session Equipment Utilized During Treatment: Oxygen Activity Tolerance: Patient tolerated treatment well Patient left: in chair;with call bell/phone within reach;with nursing/sitter in room Nurse Communication: Mobility status PT Visit Diagnosis: Difficulty in walking, not elsewhere classified (R26.2)    Time: 3086-5784 PT Time Calculation (min) (ACUTE ONLY): 19 min   Charges:   PT Evaluation $PT Eval Low Complexity: 1 Low          Livian Vanderbeck L. Katrinka Blazing, [place] Pager [phone] 10/05/2020   Enzo Montgomery 10/05/2020, 10:57 AM

## 2020-10-05 NOTE — Plan of Care (Signed)

## 2020-10-05 NOTE — Progress Notes (Signed)
Last PM notes stated that patient did not want to wear cpap due to congestion.

## 2020-10-05 NOTE — Progress Notes (Signed)
Patient up in chair. O2 weaned down to 7L HFNC. O2 sats at 94% at this time. No sign of distress. Will continue to monitor.

## 2020-10-06 MED ORDER — DEXAMETHASONE 4 MG PO TABS
6.0000 mg | ORAL_TABLET | Freq: Every day | ORAL | Status: DC
  Administered 2020-10-06 – 2020-10-07 (×2): 6 mg via ORAL
  Filled 2020-10-06 (×2): qty 1

## 2020-10-06 NOTE — Progress Notes (Signed)
Pt refuses CPAP QHS. 

## 2020-10-06 NOTE — Progress Notes (Signed)
Pt 02 sat on RA on rest = 82%, O2 3L [place] on RA=91%

## 2020-10-06 NOTE — Progress Notes (Signed)
[facility] PROGRESS NOTE    Progress Note  Ashley Adams  ZLD:[id number] DOB: Nov 26, 1970 DOA: 09/28/2020 PCP: Maryagnes Amos, FNP     Brief Narrative:   Ashley Adams is an 49 y.o. female with a history of hypertension obstructive sleep apnea came in on 09/28/2020 found to be hypoxic chest x-ray showing bilateral infiltrates and SARS-CoV-2 positive she was started empirically on IV remdesivir and steroids.  She was also placed on supplemental oxygen.  Assessment/Plan:   Acute respiratory failure with hypoxia due to Pneumonia due to COVID-19 virus Ms. Schuchart is requiring 3 L of high flow nasal cannula to keep saturations greater than 93%. She has completed her course of IV remdesivir. Down on her steroids, will change her to oral dexamethasone. Continue baricitinib for 14 days, encouraged the patient out of bed to chair, incentive spirometry flutter valve and prone for at least 16 hours a day Continue Lovenox for DVT prophylaxis. Physical therapy has evaluated the patient recommended no home health. Try to wean to room air.  Obstructive sleep apnea: Continue CPAP.  1 out of 2 blood cultures positive for staph minimus: Procalcitonin low yield she has remained afebrile off antibiotics likely a contaminant.  Transaminitis: Mild likely due to COVID-19.  Normocytic anemia: No signs of bleeding continue to monitor intermittently.  Incontinence stress related: Recommend frequent toileting.  Obesity with an estimated body mass index of 37.6 kg m2.    DVT prophylaxis: lovenox Family Communication:none Status is: Inpatient  Remains inpatient appropriate because:Hemodynamically unstable   Dispo: The patient is from: Home              Anticipated d/c is to: Home              Anticipated d/c date is: 1 days              Patient currently is not medically stable to d/c.  Code Status:     Code Status Orders  (From admission, onward)         Start      Ordered   09/29/20 0151  Full code  Continuous        09/29/20 0154        Code Status History    This patient has a current code status but no historical code status.   Advance Care Planning Activity        IV Access:    Peripheral IV   Procedures and diagnostic studies:   No results found.   Medical Consultants:    None.  Anti-Infectives:   remdesivir  Subjective:    Ashley Adams relates that her breathing significantly improved compared to yesterday she had a good night sleep and her appetite is returning.  Objective:    Vitals:   10/05/20 1327 10/05/20 1828 10/05/20 2053 10/06/20 0530  BP: 115/67  125/75 122/69  Pulse: 70  75 61  Resp: 16  20 18   Temp: 97.6 F (36.4 C)  97.9 F (36.6 C) 98.7 F (37.1 C)  TempSrc: Oral  Oral   SpO2: 98% 93% 90% 95%  Weight:      Height:       SpO2: 95 % O2 Flow Rate (L/min): 3 L/min  No intake or output data in the 24 hours ending 10/06/20 0805 Filed Weights   09/29/20 0230  Weight: 109.1 kg    Exam: General exam: In no acute distress. Respiratory system: Good air movement and clear to auscultation. Cardiovascular system:  S1 & S2 heard, RRR. No JVD. Gastrointestinal system: Abdomen is nondistended, soft and nontender.  Extremities: No pedal edema. Skin: No rashes, lesions or ulcers Psychiatry: Judgement and insight appear normal. Mood & affect appropriate. Data Reviewed:    Labs: Basic Metabolic Panel: Recent Labs  Lab 09/30/20 0545 09/30/20 0545 10/01/20 0517 10/01/20 0517 10/02/20 0719 10/02/20 0719 10/03/20 0947 10/04/20 1059  NA 139  --  139  --  138  --  140 140  K 3.7   < > 3.6   < > 3.6   < > 3.6 3.5  CL 108  --  105  --  104  --  103 103  CO2 22  --  24  --  24  --  27 27  GLUCOSE 135*  --  140*  --  134*  --  116* 122*  BUN 21*  --  26*  --  19  --  23* 24*  CREATININE 0.63  --  0.73  --  0.48  --  0.63 0.57  CALCIUM 8.3*  --  8.5*  --  8.4*  --  8.5* 8.7*   < > = values in  this interval not displayed.   GFR Estimated Creatinine Clearance: 107 mL/min (by C-G formula based on SCr of 0.57 mg/dL). Liver Function Tests: Recent Labs  Lab 09/30/20 0545 10/01/20 0517 10/02/20 0719 10/03/20 0947 10/04/20 1059  AST 75* 76* 52* 45* 41  ALT 50* 60* 54* 53* 46*  ALKPHOS 41 45 46 49 52  BILITOT 0.3 0.4 0.4 0.5 0.6  PROT 6.3* 6.7 6.4* 6.6 6.9  ALBUMIN 2.8* 2.9* 2.9* 3.0* 3.2*   No results for input(s): LIPASE, AMYLASE in the last 168 hours. No results for input(s): AMMONIA in the last 168 hours. Coagulation profile No results for input(s): INR, PROTIME in the last 168 hours. COVID-19 Labs  Recent Labs    10/03/20 0947 10/04/20 1059  DDIMER 1.02* 0.88*  CRP 0.8 0.8    Lab Results  Component Value Date   SARSCOV2NAA POSITIVE (A) 09/28/2020    CBC: Recent Labs  Lab 09/30/20 0545 10/01/20 0517  WBC 4.7 9.4  NEUTROABS 3.6 7.7  HGB 10.9* 11.2*  HCT 33.8* 36.5  MCV 89.4 93.1  PLT 248 328   Cardiac Enzymes: No results for input(s): CKTOTAL, CKMB, CKMBINDEX, TROPONINI in the last 168 hours. BNP (last 3 results) No results for input(s): PROBNP in the last 8760 hours. CBG: No results for input(s): GLUCAP in the last 168 hours. D-Dimer: Recent Labs    10/03/20 0947 10/04/20 1059  DDIMER 1.02* 0.88*   Hgb A1c: No results for input(s): HGBA1C in the last 72 hours. Lipid Profile: No results for input(s): CHOL, HDL, LDLCALC, TRIG, CHOLHDL, LDLDIRECT in the last 72 hours. Thyroid function studies: No results for input(s): TSH, T4TOTAL, T3FREE, THYROIDAB in the last 72 hours.  Invalid input(s): FREET3 Anemia work up: No results for input(s): VITAMINB12, FOLATE, FERRITIN, TIBC, IRON, RETICCTPCT in the last 72 hours. Sepsis Labs: Recent Labs  Lab 09/30/20 0545 09/30/20 1049 10/01/20 0517  PROCALCITON  --  <0.10  --   WBC 4.7  --  9.4   Microbiology Recent Results (from the past 240 hour(s))  Resp Panel by RT-PCR (Flu A&B, Covid)  Nasopharyngeal Swab     Status: Abnormal   Collection Time: 09/28/20 10:43 PM   Specimen: Nasopharyngeal Swab; Nasopharyngeal(NP) swabs in vial transport medium  Result Value Ref Range Status   SARS Coronavirus 2  by RT PCR POSITIVE (A) NEGATIVE Final    Comment: RESULT CALLED TO, READ BACK BY AND VERIFIED WITH: PETRUCELLI RN/NP @ 00:57 09/29/20 BY BOWMAN,K  (NOTE) SARS-CoV-2 target nucleic acids are DETECTED.  The SARS-CoV-2 RNA is generally detectable in upper respiratory specimens during the acute phase of infection. Positive results are indicative of the presence of the identified virus, but do not rule out bacterial infection or co-infection with other pathogens not detected by the test. Clinical correlation with patient history and other diagnostic information is necessary to determine patient infection status. The expected result is Negative.  Fact Sheet for Patients: BloggerCourse.comhttps://www.fda.gov/media/152166/download  Fact Sheet for Healthcare Providers: SeriousBroker.ithttps://www.fda.gov/media/152162/download  This test is not yet approved or cleared by the [place] FDA and  has been authorized for detection and/or diagnosis of SARS-CoV-2 by FDA under an Emergency Use Authorization (EUA).  This EUA will remain in effect (meaning thi s test can be used) for the duration of  the COVID-19 declaration under Section 564(b)(1) of the Act, 21 U.S.C. section 360bbb-3(b)(1), unless the authorization is terminated or revoked sooner.     Influenza A by PCR NEGATIVE NEGATIVE Final   Influenza B by PCR NEGATIVE NEGATIVE Final    Comment: (NOTE) The Xpert Xpress SARS-CoV-2/FLU/RSV plus assay is intended as an aid in the diagnosis of influenza from Nasopharyngeal swab specimens and should not be used as a sole basis for treatment. Nasal washings and aspirates are unacceptable for Xpert Xpress SARS-CoV-2/FLU/RSV testing.  Fact Sheet for Patients: BloggerCourse.comhttps://www.fda.gov/media/152166/download  Fact  Sheet for Healthcare Providers: SeriousBroker.ithttps://www.fda.gov/media/152162/download  This test is not yet approved or cleared by the [place] FDA and has been authorized for detection and/or diagnosis of SARS-CoV-2 by FDA under an Emergency Use Authorization (EUA). This EUA will remain in effect (meaning this test can be used) for the duration of the COVID-19 declaration under Section 564(b)(1) of the Act, 21 U.S.C. section 360bbb-3(b)(1), unless the authorization is terminated or revoked.  Performed at [facility], [address] [place], [place] [address]   Blood Culture (routine x 2)     Status: None   Collection Time: 09/28/20 11:07 PM   Specimen: BLOOD  Result Value Ref Range Status   Specimen Description   Final    BLOOD BLOOD RIGHT HAND Performed at [facility], [address] [place], [place] [address]    Special Requests   Final    BOTTLES DRAWN AEROBIC AND ANAEROBIC Blood Culture results may not be optimal due to an excessive volume of blood received in culture bottles Performed at [facility], [address] [place], [place] [address]    Culture   Final    NO GROWTH 5 DAYS Performed at [facility], [address] [place], [place] [address]    Report Status 10/04/2020 FINAL  Final  Blood Culture (routine x 2)     Status: Abnormal   Collection Time: 09/28/20 11:07 PM   Specimen: BLOOD  Result Value Ref Range Status   Specimen Description   Final    BLOOD LEFT ANTECUBITAL Performed at [facility], [address] [place], [place] [address]    Special Requests   Final    BOTTLES DRAWN AEROBIC AND ANAEROBIC Blood Culture results may not be optimal due to an excessive volume of blood received in culture bottles Performed at [facility], [address] [place], [place] [address]    Culture  Setup Time  Final    GRAM POSITIVE COCCI IN CLUSTERS IN BOTH AEROBIC AND  ANAEROBIC BOTTLES Organism ID to follow CRITICAL RESULT CALLED TO, READ BACK BY AND VERIFIED WITH: A PHAM PHARMD [id number] 09/29/20 A BROWNING    Culture (A)  Final    STAPHYLOCOCCUS HOMINIS STAPHYLOCOCCUS CAPITIS THE SIGNIFICANCE OF ISOLATING THIS ORGANISM FROM A SINGLE SET OF BLOOD CULTURES WHEN MULTIPLE SETS ARE DRAWN IS UNCERTAIN. PLEASE NOTIFY THE MICROBIOLOGY DEPARTMENT WITHIN ONE WEEK IF SPECIATION AND SENSITIVITIES ARE REQUIRED. Performed at [facility], [address], [place], [place] [address]    Report Status 10/01/2020 FINAL  Final  Blood Culture ID Panel (Reflexed)     Status: Abnormal   Collection Time: 09/28/20 11:07 PM  Result Value Ref Range Status   Enterococcus faecalis NOT DETECTED NOT DETECTED Final   Enterococcus Faecium NOT DETECTED NOT DETECTED Final   Listeria monocytogenes NOT DETECTED NOT DETECTED Final   Staphylococcus species DETECTED (A) NOT DETECTED Final    Comment: CRITICAL RESULT CALLED TO, READ BACK BY AND VERIFIED WITH: A PHAM PHARMD [id number] 09/29/20 A BROWNING    Staphylococcus aureus (BCID) NOT DETECTED NOT DETECTED Final   Staphylococcus epidermidis NOT DETECTED NOT DETECTED Final   Staphylococcus lugdunensis NOT DETECTED NOT DETECTED Final   Streptococcus species NOT DETECTED NOT DETECTED Final   Streptococcus agalactiae NOT DETECTED NOT DETECTED Final   Streptococcus pneumoniae NOT DETECTED NOT DETECTED Final   Streptococcus pyogenes NOT DETECTED NOT DETECTED Final   A.calcoaceticus-baumannii NOT DETECTED NOT DETECTED Final   Bacteroides fragilis NOT DETECTED NOT DETECTED Final   Enterobacterales NOT DETECTED NOT DETECTED Final   Enterobacter cloacae complex NOT DETECTED NOT DETECTED Final   Escherichia coli NOT DETECTED NOT DETECTED Final   Klebsiella aerogenes NOT DETECTED NOT DETECTED Final   Klebsiella oxytoca NOT DETECTED NOT DETECTED Final   Klebsiella pneumoniae NOT DETECTED NOT DETECTED Final   Proteus species NOT DETECTED NOT  DETECTED Final   Salmonella species NOT DETECTED NOT DETECTED Final   Serratia marcescens NOT DETECTED NOT DETECTED Final   Haemophilus influenzae NOT DETECTED NOT DETECTED Final   Neisseria meningitidis NOT DETECTED NOT DETECTED Final   Pseudomonas aeruginosa NOT DETECTED NOT DETECTED Final   Stenotrophomonas maltophilia NOT DETECTED NOT DETECTED Final   Candida albicans NOT DETECTED NOT DETECTED Final   Candida auris NOT DETECTED NOT DETECTED Final   Candida glabrata NOT DETECTED NOT DETECTED Final   Candida krusei NOT DETECTED NOT DETECTED Final   Candida parapsilosis NOT DETECTED NOT DETECTED Final   Candida tropicalis NOT DETECTED NOT DETECTED Final   Cryptococcus neoformans/gattii NOT DETECTED NOT DETECTED Final    Comment: Performed at [facility], [address], [place], [place] [address]  Respiratory Panel by PCR     Status: None   Collection Time: 09/30/20  6:14 PM  Result Value Ref Range Status   Adenovirus NOT DETECTED NOT DETECTED Final   Coronavirus 229E NOT DETECTED NOT DETECTED Final    Comment: (NOTE) The Coronavirus on the Respiratory Panel, DOES NOT test for the novel  Coronavirus (2019 nCoV)    Coronavirus HKU1 NOT DETECTED NOT DETECTED Final   Coronavirus NL63 NOT DETECTED NOT DETECTED Final   Coronavirus OC43 NOT DETECTED NOT DETECTED Final   Metapneumovirus NOT DETECTED NOT DETECTED Final   Rhinovirus / Enterovirus NOT DETECTED NOT DETECTED Final   Influenza A NOT DETECTED NOT DETECTED Final   Influenza B NOT DETECTED NOT DETECTED Final   Parainfluenza Virus  1 NOT DETECTED NOT DETECTED Final   Parainfluenza Virus 2 NOT DETECTED NOT DETECTED Final   Parainfluenza Virus 3 NOT DETECTED NOT DETECTED Final   Parainfluenza Virus 4 NOT DETECTED NOT DETECTED Final   Respiratory Syncytial Virus NOT DETECTED NOT DETECTED Final   Bordetella pertussis NOT DETECTED NOT DETECTED Final   Chlamydophila pneumoniae NOT DETECTED NOT DETECTED Final   Mycoplasma  pneumoniae NOT DETECTED NOT DETECTED Final    Comment: Performed at [facility], [address] [place], [place] [address]     Medications:   . vitamin C  500 mg Oral Daily  . baricitinib  4 mg Oral Daily  . cholecalciferol  1,000 Units Oral Daily  . enoxaparin (LOVENOX) injection  55 mg Subcutaneous Q24H  . methylPREDNISolone (SOLU-MEDROL) injection  40 mg Intravenous Q12H  . vitamin B-12  100 mcg Oral Daily  . zinc sulfate  220 mg Oral Daily   Continuous Infusions:    LOS: 7 days   Marinda Elk  [facility]  10/06/2020, 8:05 AM

## 2020-10-06 NOTE — Plan of Care (Signed)

## 2020-10-07 MED ORDER — DEXAMETHASONE 6 MG PO TABS: 3.0000 mg | ORAL_TABLET | Freq: Every day | ORAL | 0 refills | Status: AC

## 2020-10-07 NOTE — Discharge Instructions (Signed)
You are being discharged from the hospital after treatment for covid-19 infection. You are felt to be stable enough to no longer require inpatient monitoring, testing, and treatment, though you will need to follow the recommendations below:  - You may return to work/leave 12 days after discharge. This is based on the CDC's -test criteria for ending self-isolation. - Follow up with your doctor in the next week via telehealth or seek medical attention right away if your symptoms get WORSE.   Wear a facemask You should wear a facemask that covers your nose and mouth when you are in the same room with other people and when you visit a healthcare provider. People who live with or visit you should also wear a facemask while they are in the same room with you.  Separate yourself from other people in your home As much as possible, you should stay in a different room from other people in your home. Also, you should use a separate bathroom, if available.  Avoid sharing household items You should not share dishes, drinking glasses, cups, eating utensils, towels, bedding, or other items with other people in your home. After using these items, you should wash them thoroughly with soap and water.  Cover your coughs and sneezes Cover your mouth and nose with a tissue when you cough or sneeze, or you can cough or sneeze into your sleeve. Throw used tissues in a lined trash can, and immediately wash your hands with soap and water for at least 20 seconds or use an alcohol-based hand rub.  Wash your [employer] your hands often and thoroughly with soap and water for at least 20 seconds. You can use an alcohol-based hand sanitizer if soap and water are not available and if your hands are not visibly dirty. Avoid touching your eyes, nose, and mouth with unwashed hands.  _________________  Directions for those who live with, or provide care at home for you:   Limit the number of people who have contact with  the patient  If possible, have only one caregiver for the patient.  Other household members should stay in another home or place of residence. If this is not possible, they should stay  in another room, or be separated from the patient as much as possible. Use a separate bathroom, if available.  Restrict visitors who do not have an essential need to be in the home.  Ensure good ventilation Make sure that shared spaces in the home have good air flow, such as from an air conditioner or an opened window, weather permitting.  Wash your hands often  Wash your hands often and thoroughly with soap and water for at least 20 seconds. You can use an alcohol based hand sanitizer if soap and water are not available and if your hands are not visibly dirty.  Avoid touching your eyes, nose, and mouth with unwashed hands.  Use disposable paper towels to dry your hands. If not available, use dedicated cloth towels and replace them when they become wet.  Wear a facemask and gloves  Wear a disposable facemask at all times in the room and gloves when you touch or have contact with the patients blood, body fluids, and/or secretions or excretions, such as sweat, saliva, sputum, nasal mucus, vomit, urine, or feces.  Ensure the mask fits over your nose and mouth tightly, and do not touch it during use.  Throw out disposable facemasks and gloves after using them. Do not reuse.  Wash your hands  immediately after removing your facemask and gloves.  If your personal clothing becomes contaminated, carefully remove clothing and launder. Wash your hands after handling contaminated clothing.  Place all used disposable facemasks, gloves, and other waste in a lined container before disposing them with other household waste.  Remove gloves and wash your hands immediately after handling these items.  Do not share dishes, glasses, or other household items with the patient  Avoid sharing household items. You should  not share dishes, drinking glasses, cups, eating utensils, towels, bedding, or other items with a patient who is confirmed to have, or being evaluated for, COVID-19 infection.  After the person uses these items, you should wash them thoroughly with soap and water.  Wash laundry thoroughly  Immediately remove and wash clothes or bedding that have blood, body fluids, and/or secretions or excretions, such as sweat, saliva, sputum, nasal mucus, vomit, urine, or feces, on them.  Wear gloves when handling laundry from the patient.  Read and follow directions on labels of laundry or clothing items and detergent. In general, wash and dry with the warmest temperatures recommended on the label.  Clean all areas the individual has used often  Clean all touchable surfaces, such as counters, tabletops, doorknobs, bathroom fixtures, toilets, phones, keyboards, tablets, and bedside tables, every day. Also, clean any surfaces that may have blood, body fluids, and/or secretions or excretions on them.  Wear gloves when cleaning surfaces the patient has come in contact with.  Use a diluted bleach solution (e.g., dilute bleach with 1 part bleach and 10 parts water) or a household disinfectant with a label that says EPA-registered for coronaviruses. To make a bleach solution at home, add 1 tablespoon of bleach to 1 quart (4 cups) of water. For a larger supply, add  cup of bleach to 1 gallon (16 cups) of water.  Read labels of cleaning products and follow recommendations provided on product labels. Labels contain instructions for safe and effective use of the cleaning product including precautions you should take when applying the product, such as wearing gloves or eye protection and making sure you have good ventilation during use of the product.  Remove gloves and wash hands immediately after cleaning.  Monitor yourself for signs and symptoms of illness Caregivers and household members are considered close  contacts, should monitor their health, and will be asked to limit movement outside of the home to the extent possible. Follow the monitoring steps for close contacts listed on the symptom monitoring form.   ? If you have additional questions, contact your local health department or call the epidemiologist on call at [phone] (available 24/7). ? This guidance is subject to change. For the most up-to-date guidance from [facility], please refer to their website: [email or web address].hu

## 2020-10-07 NOTE — Discharge Summary (Signed)
Physician Discharge Summary  Ashley Adams:[id number] DOB: Apr 14, 1971 DOA: 09/28/2020  PCP: Maryagnes Amos, FNP  Admit date: 09/28/2020 Discharge date: 10/07/2020  Admitted From: Home Disposition:  Home  Recommendations for Outpatient Follow-up:  1. Follow up with PCP in 1-2 weeks 2. Please obtain BMP/CBC in one week   Home Health:No Equipment/Devices: 3 L of oxygen nasal cannula at home  Discharge Condition:stable CODE STATUS:Full Diet recommendation: Heart Healthy   Brief/Interim Summary:  49 y.o. female with a history of hypertension obstructive sleep apnea came in on 09/28/2020 found to be hypoxic chest x-ray showing bilateral infiltrates and SARS-CoV-2 positive she was started empirically on IV remdesivir and steroids.  She was also placed on supplemental oxygen  Discharge Diagnoses:  Principal Problem:   Pneumonia due to COVID-19 virus Active Problems:   Acute hypoxemic respiratory failure (HCC)   Sepsis (HCC)   Hypokalemia   Transaminitis  Acute respiratory failure with hypoxia secondarily to pneumonia due to COVID-19: She was started on supplemental oxygen IV steroids, remdesivir and baricitinib.  Her oxygen requirements slowly improved.  She completed her course of IV remdesivir and baricitinib in the hospital, she was changed to oral dexamethasone which we should continue as an outpatient for 3 additional days. On the day of discharge she was on 3 L of oxygen satting greater than 96%, she will go home on on supplemental oxygen for 2 weeks wean her down as an outpatient.  Obstructive sleep apnea: Review CPAP at night.  Transaminitis: Likely due to COVID-19 now resolved.  Normocytic anemia: No signs of overt bleeding.  Urinary incontinence stress related:   Discharge Instructions  Discharge Instructions    Diet - low sodium heart healthy   Complete by: As directed    Increase activity slowly   Complete by: As directed      Allergies as of  10/07/2020   No Known Allergies     Medication List    STOP taking these medications   clindamycin 150 MG capsule Commonly known as: CLEOCIN   ibuprofen 200 MG tablet Commonly known as: ADVIL   oxyCODONE-acetaminophen 10-325 MG tablet Commonly known as: Percocet     TAKE these medications   aspirin 81 MG chewable tablet Chew 81 mg by mouth daily.   dexamethasone 6 MG tablet Commonly known as: DECADRON Take 0.5 tablets (3 mg total) by mouth daily for 3 days.   MUCINEX DM PO Take 1 tablet by mouth daily.   Vitamin D (Ergocalciferol) 1.25 MG (50000 UNIT) Caps capsule Commonly known as: DRISDOL Take 50,000 Units by mouth daily.       No Known Allergies  Consultations:  None   Procedures/Studies: CT ANGIO CHEST PE W OR WO CONTRAST  Result Date: 09/29/2020 CLINICAL DATA:  Pulmonary embolism suspected.  COVID positive. EXAM: CT ANGIOGRAPHY CHEST WITH CONTRAST TECHNIQUE: Multidetector CT imaging of the chest was performed using the standard protocol during bolus administration of intravenous contrast. Multiplanar CT image reconstructions and MIPs were obtained to evaluate the vascular anatomy. CONTRAST:  OMNIPAQUE IOHEXOL 350 MG/ML SOLN COMPARISON:  None. FINDINGS: Cardiovascular: Satisfactory opacification of the pulmonary arteries to the segmental level. No evidence of pulmonary embolism when allowing for levels of motion and streak artifact. Normal heart size. No pericardial effusion. Mediastinum/Nodes: Negative for adenopathy or mass Lungs/Pleura: Patchy ground-glass opacity with subpleural predilection and normal intervening lung. No effusion or air leak Upper Abdomen: Unremarkable Musculoskeletal: Unremarkable. Review of the MIP images confirms the above findings. IMPRESSION: 1. COVID  pattern pneumonia. 2. No evidence for pulmonary embolism. Electronically Signed   By: Marnee Spring M.D.   On: 09/29/2020 04:11   DG CHEST PORT 1 VIEW  Result Date:  10/04/2020 CLINICAL DATA:  Acute hypoxic respiratory failure due to COVID-19. EXAM: PORTABLE CHEST 1 VIEW COMPARISON:  Chest x-ray dated 09/28/2020. FINDINGS: Patchy bibasilar airspace opacities, LEFT greater than RIGHT, at least mildly worsened within the LEFT lower lung compared to the previous chest x-ray. No pneumothorax is seen. Heart size and mediastinal contours are stable. IMPRESSION: Bibasilar pneumonia, worsened on the LEFT compared to chest x-ray of 09/28/2020. Electronically Signed   By: Bary Richard M.D.   On: 10/04/2020 05:49   DG Chest Port 1 View  Result Date: 09/28/2020 CLINICAL DATA:  Cough and fever for several days EXAM: PORTABLE CHEST 1 VIEW COMPARISON:  12/08/2014 FINDINGS: Cardiac shadow is within normal limits. The lungs are hypoinflated with mild bibasilar atelectatic changes. No sizable effusion is noted. No bony abnormality is seen. IMPRESSION: Mild bibasilar atelectatic changes right greater than left. No other focal abnormality is noted. Electronically Signed   By: Alcide Clever M.D.   On: 09/28/2020 21:47    (Echo, Carotid, EGD, Colonoscopy, ERCP)    Subjective: No new complaints.  Discharge Exam: Vitals:   10/06/20 2123 10/07/20 0601  BP: 124/76 112/71  Pulse: 60 64  Resp: 18 18  Temp: 98.2 F (36.8 C) 98.4 F (36.9 C)  SpO2: 98% 92%   Vitals:   10/06/20 0530 10/06/20 1227 10/06/20 2123 10/07/20 0601  BP: 122/69 128/77 124/76 112/71  Pulse: 61 88 60 64  Resp: Temp: 98.7 F (37.1 C) 98 F (36.7 C) 98.2 F (36.8 C) 98.4 F (36.9 C)  TempSrc:  Oral Oral Oral  SpO2: 95% 96% 98% 92%  Weight:      Height:        General: Pt is alert, awake, not in acute distress Cardiovascular: RRR, S1/S2 +, no rubs, no gallops Respiratory: CTA bilaterally, no wheezing, no rhonchi Abdominal: Soft, NT, ND, bowel sounds + Extremities: no edema, no cyanosis    The results of significant diagnostics from this hospitalization (including imaging,  microbiology, ancillary and laboratory) are listed below for reference.     Microbiology: Recent Results (from the past 240 hour(s))  Resp Panel by RT-PCR (Flu A&B, Covid) Nasopharyngeal Swab     Status: Abnormal   Collection Time: 09/28/20 10:43 PM   Specimen: Nasopharyngeal Swab; Nasopharyngeal(NP) swabs in vial transport medium  Result Value Ref Range Status   SARS Coronavirus 2 by RT PCR POSITIVE (A) NEGATIVE Final    Comment: RESULT CALLED TO, READ BACK BY AND VERIFIED WITH: PETRUCELLI RN/NP @ 00:57 09/29/20 BY BOWMAN,K  (NOTE) SARS-CoV-2 target nucleic acids are DETECTED.  The SARS-CoV-2 RNA is generally detectable in upper respiratory specimens during the acute phase of infection. Positive results are indicative of the presence of the identified virus, but do not rule out bacterial infection or co-infection with other pathogens not detected by the test. Clinical correlation with patient history and other diagnostic information is necessary to determine patient infection status. The expected result is Negative.  Fact Sheet for Patients: BloggerCourse.com  Fact Sheet for Healthcare Providers: SeriousBroker.it  This test is not yet approved or cleared by the [place] FDA and  has been authorized for detection and/or diagnosis of SARS-CoV-2 by FDA under an Emergency Use Authorization (EUA).  This EUA will remain in effect (meaning thi s  test can be used) for the duration of  the COVID-19 declaration under Section 564(b)(1) of the Act, 21 U.S.C. section 360bbb-3(b)(1), unless the authorization is terminated or revoked sooner.     Influenza A by PCR NEGATIVE NEGATIVE Final   Influenza B by PCR NEGATIVE NEGATIVE Final    Comment: (NOTE) The Xpert Xpress SARS-CoV-2/FLU/RSV plus assay is intended as an aid in the diagnosis of influenza from Nasopharyngeal swab specimens and should not be used as a sole basis for treatment.  Nasal washings and aspirates are unacceptable for Xpert Xpress SARS-CoV-2/FLU/RSV testing.  Fact Sheet for Patients: BloggerCourse.com  Fact Sheet for Healthcare Providers: SeriousBroker.it  This test is not yet approved or cleared by the [place] FDA and has been authorized for detection and/or diagnosis of SARS-CoV-2 by FDA under an Emergency Use Authorization (EUA). This EUA will remain in effect (meaning this test can be used) for the duration of the COVID-19 declaration under Section 564(b)(1) of the Act, 21 U.S.C. section 360bbb-3(b)(1), unless the authorization is terminated or revoked.  Performed at [facility], [address], [place], [place] [address]   Blood Culture (routine x 2)     Status: None   Collection Time: 09/28/20 11:07 PM   Specimen: BLOOD  Result Value Ref Range Status   Specimen Description   Final    BLOOD BLOOD RIGHT HAND Performed at [facility], [address] [place], [place] [address]    Special Requests   Final    BOTTLES DRAWN AEROBIC AND ANAEROBIC Blood Culture results may not be optimal due to an excessive volume of blood received in culture bottles Performed at [facility], [address] [place], [place] [address]    Culture   Final    NO GROWTH 5 DAYS Performed at [facility], [address] [place], [place] [address]    Report Status 10/04/2020 FINAL  Final  Blood Culture (routine x 2)     Status: Abnormal   Collection Time: 09/28/20 11:07 PM   Specimen: BLOOD  Result Value Ref Range Status   Specimen Description   Final    BLOOD LEFT ANTECUBITAL Performed at [facility], [address], [place], [place] [address]    Special Requests   Final    BOTTLES DRAWN AEROBIC AND ANAEROBIC Blood Culture results may not be optimal due to an excessive volume of blood received in culture  bottles Performed at [facility], [address] [place], [place] [address]    Culture  Setup Time   Final    GRAM POSITIVE COCCI IN CLUSTERS IN BOTH AEROBIC AND ANAEROBIC BOTTLES Organism ID to follow CRITICAL RESULT CALLED TO, READ BACK BY AND VERIFIED WITH: A PHAM PHARMD [id number] 09/29/20 A BROWNING    Culture (A)  Final    STAPHYLOCOCCUS HOMINIS STAPHYLOCOCCUS CAPITIS THE SIGNIFICANCE OF ISOLATING THIS ORGANISM FROM A SINGLE SET OF BLOOD CULTURES WHEN MULTIPLE SETS ARE DRAWN IS UNCERTAIN. PLEASE NOTIFY THE MICROBIOLOGY DEPARTMENT WITHIN ONE WEEK IF SPECIATION AND SENSITIVITIES ARE REQUIRED. Performed at [facility], [address], [place], [place] [address]    Report Status 10/01/2020 FINAL  Final  Blood Culture ID Panel (Reflexed)     Status: Abnormal   Collection Time: 09/28/20 11:07 PM  Result Value Ref Range Status   Enterococcus faecalis NOT DETECTED NOT DETECTED Final   Enterococcus Faecium NOT DETECTED NOT DETECTED Final   Listeria monocytogenes NOT DETECTED  NOT DETECTED Final   Staphylococcus species DETECTED (A) NOT DETECTED Final    Comment: CRITICAL RESULT CALLED TO, READ BACK BY AND VERIFIED WITH: A PHAM PHARMD [id number] 09/29/20 A BROWNING    Staphylococcus aureus (BCID) NOT DETECTED NOT DETECTED Final   Staphylococcus epidermidis NOT DETECTED NOT DETECTED Final   Staphylococcus lugdunensis NOT DETECTED NOT DETECTED Final   Streptococcus species NOT DETECTED NOT DETECTED Final   Streptococcus agalactiae NOT DETECTED NOT DETECTED Final   Streptococcus pneumoniae NOT DETECTED NOT DETECTED Final   Streptococcus pyogenes NOT DETECTED NOT DETECTED Final   A.calcoaceticus-baumannii NOT DETECTED NOT DETECTED Final   Bacteroides fragilis NOT DETECTED NOT DETECTED Final   Enterobacterales NOT DETECTED NOT DETECTED Final   Enterobacter cloacae complex NOT DETECTED NOT DETECTED Final   Escherichia coli NOT DETECTED NOT DETECTED Final   Klebsiella  aerogenes NOT DETECTED NOT DETECTED Final   Klebsiella oxytoca NOT DETECTED NOT DETECTED Final   Klebsiella pneumoniae NOT DETECTED NOT DETECTED Final   Proteus species NOT DETECTED NOT DETECTED Final   Salmonella species NOT DETECTED NOT DETECTED Final   Serratia marcescens NOT DETECTED NOT DETECTED Final   Haemophilus influenzae NOT DETECTED NOT DETECTED Final   Neisseria meningitidis NOT DETECTED NOT DETECTED Final   Pseudomonas aeruginosa NOT DETECTED NOT DETECTED Final   Stenotrophomonas maltophilia NOT DETECTED NOT DETECTED Final   Candida albicans NOT DETECTED NOT DETECTED Final   Candida auris NOT DETECTED NOT DETECTED Final   Candida glabrata NOT DETECTED NOT DETECTED Final   Candida krusei NOT DETECTED NOT DETECTED Final   Candida parapsilosis NOT DETECTED NOT DETECTED Final   Candida tropicalis NOT DETECTED NOT DETECTED Final   Cryptococcus neoformans/gattii NOT DETECTED NOT DETECTED Final    Comment: Performed at [facility], [address] [place], [place] [address]  Respiratory Panel by PCR     Status: None   Collection Time: 09/30/20  6:14 PM  Result Value Ref Range Status   Adenovirus NOT DETECTED NOT DETECTED Final   Coronavirus 229E NOT DETECTED NOT DETECTED Final    Comment: (NOTE) The Coronavirus on the Respiratory Panel, DOES NOT test for the novel  Coronavirus (2019 nCoV)    Coronavirus HKU1 NOT DETECTED NOT DETECTED Final   Coronavirus NL63 NOT DETECTED NOT DETECTED Final   Coronavirus OC43 NOT DETECTED NOT DETECTED Final   Metapneumovirus NOT DETECTED NOT DETECTED Final   Rhinovirus / Enterovirus NOT DETECTED NOT DETECTED Final   Influenza A NOT DETECTED NOT DETECTED Final   Influenza B NOT DETECTED NOT DETECTED Final   Parainfluenza Virus 1 NOT DETECTED NOT DETECTED Final   Parainfluenza Virus 2 NOT DETECTED NOT DETECTED Final   Parainfluenza Virus 3 NOT DETECTED NOT DETECTED Final   Parainfluenza Virus 4 NOT DETECTED NOT DETECTED Final    Respiratory Syncytial Virus NOT DETECTED NOT DETECTED Final   Bordetella pertussis NOT DETECTED NOT DETECTED Final   Chlamydophila pneumoniae NOT DETECTED NOT DETECTED Final   Mycoplasma pneumoniae NOT DETECTED NOT DETECTED Final    Comment: Performed at [facility], [address], [place], [place] [address]     Labs: BNP (last 3 results) No results for input(s): BNP in the last 8760 hours. Basic Metabolic Panel: Recent Labs  Lab 10/01/20 0517 10/02/20 0719 10/03/20 0947 10/04/20 1059  NA 139 138 140 140  K 3.6 3.6 3.6 3.5  CL 105 104 103 103  CO2 24 24 27 27   GLUCOSE 140* 134* 116* 122*  BUN 26* 19 23* 24*  CREATININE 0.73 0.48 0.63 0.57  CALCIUM 8.5* 8.4* 8.5* 8.7*   Liver Function Tests: Recent Labs  Lab 10/01/20 0517 10/02/20 0719 10/03/20 0947 10/04/20 1059  AST 76* 52* 45* 41  ALT 60* 54* 53* 46*  ALKPHOS 45 46 49 52  BILITOT 0.4 0.4 0.5 0.6  PROT 6.7 6.4* 6.6 6.9  ALBUMIN 2.9* 2.9* 3.0* 3.2*   No results for input(s): LIPASE, AMYLASE in the last 168 hours. No results for input(s): AMMONIA in the last 168 hours. CBC: Recent Labs  Lab 10/01/20 0517  WBC 9.4  NEUTROABS 7.7  HGB 11.2*  HCT 36.5  MCV 93.1  PLT 328   Cardiac Enzymes: No results for input(s): CKTOTAL, CKMB, CKMBINDEX, TROPONINI in the last 168 hours. BNP: Invalid input(s): POCBNP CBG: No results for input(s): GLUCAP in the last 168 hours. D-Dimer Recent Labs    10/04/20 1059  DDIMER 0.88*   Hgb A1c No results for input(s): HGBA1C in the last 72 hours. Lipid Profile No results for input(s): CHOL, HDL, LDLCALC, TRIG, CHOLHDL, LDLDIRECT in the last 72 hours. Thyroid function studies No results for input(s): TSH, T4TOTAL, T3FREE, THYROIDAB in the last 72 hours.  Invalid input(s): FREET3 Anemia work up No results for input(s): VITAMINB12, FOLATE, FERRITIN, TIBC, IRON, RETICCTPCT in the last 72 hours. Urinalysis    Component Value Date/Time   COLORURINE YELLOW 05/09/2012  0725   APPEARANCEUR HAZY (A) 05/09/2012 0725   LABSPEC 1.020 05/09/2012 0725   PHURINE 6.0 05/09/2012 0725   GLUCOSEU NEGATIVE 05/09/2012 0725   HGBUR NEGATIVE 05/09/2012 0725   HGBUR negative 06/27/2009 1006   BILIRUBINUR large (A) 02/01/2018 1552   KETONESUR small (15) (A) 02/01/2018 1552   KETONESUR NEGATIVE 05/09/2012 0725   PROTEINUR >=300 (A) 02/01/2018 1552   PROTEINUR NEGATIVE 05/09/2012 0725   UROBILINOGEN 2.0 (A) 02/01/2018 1552   UROBILINOGEN 0.2 05/09/2012 0725   NITRITE Positive (A) 02/01/2018 1552   NITRITE NEGATIVE 05/09/2012 0725   LEUKOCYTESUR Negative 02/01/2018 1552   Sepsis Labs Invalid input(s): PROCALCITONIN,  WBC,  LACTICIDVEN Microbiology Recent Results (from the past 240 hour(s))  Resp Panel by RT-PCR (Flu A&B, Covid) Nasopharyngeal Swab     Status: Abnormal   Collection Time: 09/28/20 10:43 PM   Specimen: Nasopharyngeal Swab; Nasopharyngeal(NP) swabs in vial transport medium  Result Value Ref Range Status   SARS Coronavirus 2 by RT PCR POSITIVE (A) NEGATIVE Final    Comment: RESULT CALLED TO, READ BACK BY AND VERIFIED WITH: PETRUCELLI RN/NP @ 00:57 09/29/20 BY BOWMAN,K  (NOTE) SARS-CoV-2 target nucleic acids are DETECTED.  The SARS-CoV-2 RNA is generally detectable in upper respiratory specimens during the acute phase of infection. Positive results are indicative of the presence of the identified virus, but do not rule out bacterial infection or co-infection with other pathogens not detected by the test. Clinical correlation with patient history and other diagnostic information is necessary to determine patient infection status. The expected result is Negative.  Fact Sheet for Patients: BloggerCourse.com  Fact Sheet for Healthcare Providers: SeriousBroker.it  This test is not yet approved or cleared by the [place] FDA and  has been authorized for detection and/or diagnosis of SARS-CoV-2  by FDA under an Emergency Use Authorization (EUA).  This EUA will remain in effect (meaning thi s test can be used) for the duration of  the COVID-19 declaration under Section 564(b)(1) of the Act, 21 U.S.C. section 360bbb-3(b)(1), unless the authorization is terminated or revoked sooner.  Influenza A by PCR NEGATIVE NEGATIVE Final   Influenza B by PCR NEGATIVE NEGATIVE Final    Comment: (NOTE) The Xpert Xpress SARS-CoV-2/FLU/RSV plus assay is intended as an aid in the diagnosis of influenza from Nasopharyngeal swab specimens and should not be used as a sole basis for treatment. Nasal washings and aspirates are unacceptable for Xpert Xpress SARS-CoV-2/FLU/RSV testing.  Fact Sheet for Patients: BloggerCourse.comhttps://www.fda.gov/media/152166/download  Fact Sheet for Healthcare Providers: SeriousBroker.ithttps://www.fda.gov/media/152162/download  This test is not yet approved or cleared by the [place] FDA and has been authorized for detection and/or diagnosis of SARS-CoV-2 by FDA under an Emergency Use Authorization (EUA). This EUA will remain in effect (meaning this test can be used) for the duration of the COVID-19 declaration under Section 564(b)(1) of the Act, 21 U.S.C. section 360bbb-3(b)(1), unless the authorization is terminated or revoked.  Performed at [facility], [address] [place], [place] [address]   Blood Culture (routine x 2)     Status: None   Collection Time: 09/28/20 11:07 PM   Specimen: BLOOD  Result Value Ref Range Status   Specimen Description   Final    BLOOD BLOOD RIGHT HAND Performed at [facility], [address] [place], [place] [address]    Special Requests   Final    BOTTLES DRAWN AEROBIC AND ANAEROBIC Blood Culture results may not be optimal due to an excessive volume of blood received in culture bottles Performed at [facility], [address] [place], [place] [address]    Culture   Final    NO  GROWTH 5 DAYS Performed at [facility], [address] [place], [place] [address]    Report Status 10/04/2020 FINAL  Final  Blood Culture (routine x 2)     Status: Abnormal   Collection Time: 09/28/20 11:07 PM   Specimen: BLOOD  Result Value Ref Range Status   Specimen Description   Final    BLOOD LEFT ANTECUBITAL Performed at [facility], [address] [place], [place] [address]    Special Requests   Final    BOTTLES DRAWN AEROBIC AND ANAEROBIC Blood Culture results may not be optimal due to an excessive volume of blood received in culture bottles Performed at [facility], [address] [place], [place] [address]    Culture  Setup Time   Final    GRAM POSITIVE COCCI IN CLUSTERS IN BOTH AEROBIC AND ANAEROBIC BOTTLES Organism ID to follow CRITICAL RESULT CALLED TO, READ BACK BY AND VERIFIED WITH: A PHAM PHARMD [id number] 09/29/20 A BROWNING    Culture (A)  Final    STAPHYLOCOCCUS HOMINIS STAPHYLOCOCCUS CAPITIS THE SIGNIFICANCE OF ISOLATING THIS ORGANISM FROM A SINGLE SET OF BLOOD CULTURES WHEN MULTIPLE SETS ARE DRAWN IS UNCERTAIN. PLEASE NOTIFY THE MICROBIOLOGY DEPARTMENT WITHIN ONE WEEK IF SPECIATION AND SENSITIVITIES ARE REQUIRED. Performed at [facility], [address] [place], [place] [address]    Report Status 10/01/2020 FINAL  Final  Blood Culture ID Panel (Reflexed)     Status: Abnormal   Collection Time: 09/28/20 11:07 PM  Result Value Ref Range Status   Enterococcus faecalis NOT DETECTED NOT DETECTED Final   Enterococcus Faecium NOT DETECTED NOT DETECTED Final   Listeria monocytogenes NOT DETECTED NOT DETECTED Final   Staphylococcus species DETECTED (A) NOT DETECTED Final    Comment: CRITICAL RESULT CALLED TO, READ BACK BY AND VERIFIED WITH: A PHAM PHARMD [id number] 09/29/20 A BROWNING  Staphylococcus aureus (BCID) NOT DETECTED NOT DETECTED Final   Staphylococcus epidermidis NOT DETECTED NOT DETECTED Final    Staphylococcus lugdunensis NOT DETECTED NOT DETECTED Final   Streptococcus species NOT DETECTED NOT DETECTED Final   Streptococcus agalactiae NOT DETECTED NOT DETECTED Final   Streptococcus pneumoniae NOT DETECTED NOT DETECTED Final   Streptococcus pyogenes NOT DETECTED NOT DETECTED Final   A.calcoaceticus-baumannii NOT DETECTED NOT DETECTED Final   Bacteroides fragilis NOT DETECTED NOT DETECTED Final   Enterobacterales NOT DETECTED NOT DETECTED Final   Enterobacter cloacae complex NOT DETECTED NOT DETECTED Final   Escherichia coli NOT DETECTED NOT DETECTED Final   Klebsiella aerogenes NOT DETECTED NOT DETECTED Final   Klebsiella oxytoca NOT DETECTED NOT DETECTED Final   Klebsiella pneumoniae NOT DETECTED NOT DETECTED Final   Proteus species NOT DETECTED NOT DETECTED Final   Salmonella species NOT DETECTED NOT DETECTED Final   Serratia marcescens NOT DETECTED NOT DETECTED Final   Haemophilus influenzae NOT DETECTED NOT DETECTED Final   Neisseria meningitidis NOT DETECTED NOT DETECTED Final   Pseudomonas aeruginosa NOT DETECTED NOT DETECTED Final   Stenotrophomonas maltophilia NOT DETECTED NOT DETECTED Final   Candida albicans NOT DETECTED NOT DETECTED Final   Candida auris NOT DETECTED NOT DETECTED Final   Candida glabrata NOT DETECTED NOT DETECTED Final   Candida krusei NOT DETECTED NOT DETECTED Final   Candida parapsilosis NOT DETECTED NOT DETECTED Final   Candida tropicalis NOT DETECTED NOT DETECTED Final   Cryptococcus neoformans/gattii NOT DETECTED NOT DETECTED Final    Comment: Performed at [facility], [address] [place], [place] [address]  Respiratory Panel by PCR     Status: None   Collection Time: 09/30/20  6:14 PM  Result Value Ref Range Status   Adenovirus NOT DETECTED NOT DETECTED Final   Coronavirus 229E NOT DETECTED NOT DETECTED Final    Comment: (NOTE) The Coronavirus on the Respiratory Panel, DOES NOT test for the novel  Coronavirus (2019 nCoV)     Coronavirus HKU1 NOT DETECTED NOT DETECTED Final   Coronavirus NL63 NOT DETECTED NOT DETECTED Final   Coronavirus OC43 NOT DETECTED NOT DETECTED Final   Metapneumovirus NOT DETECTED NOT DETECTED Final   Rhinovirus / Enterovirus NOT DETECTED NOT DETECTED Final   Influenza A NOT DETECTED NOT DETECTED Final   Influenza B NOT DETECTED NOT DETECTED Final   Parainfluenza Virus 1 NOT DETECTED NOT DETECTED Final   Parainfluenza Virus 2 NOT DETECTED NOT DETECTED Final   Parainfluenza Virus 3 NOT DETECTED NOT DETECTED Final   Parainfluenza Virus 4 NOT DETECTED NOT DETECTED Final   Respiratory Syncytial Virus NOT DETECTED NOT DETECTED Final   Bordetella pertussis NOT DETECTED NOT DETECTED Final   Chlamydophila pneumoniae NOT DETECTED NOT DETECTED Final   Mycoplasma pneumoniae NOT DETECTED NOT DETECTED Final    Comment: Performed at [facility], [address] [place], [place] [address]     Time coordinating discharge: Over 30 minutes  SIGNED:   Marinda Elk, MD  [facility] 10/07/2020, 7:33 AM Pager   If 7PM-7AM, please contact night-coverage [email or web address] Password [email or web address]

## 2020-10-07 NOTE — Plan of Care (Signed)
  Problem: Activity: Goal: Risk for activity intolerance will decrease Outcome: Adequate for Discharge   Problem: Respiratory: Goal: Will maintain a patent airway Outcome: Adequate for Discharge   

## 2020-10-07 NOTE — Progress Notes (Signed)
SATURATION QUALIFICATIONS: (This note is used to comply with regulatory documentation for home oxygen)  Patient Saturations on Room Air at Rest = 86%  Patient Saturations on Room Air while Ambulating = 85%  Patient Saturations on 3 Liters of oxygen while Ambulating = 91%  Please briefly explain why patient needs home oxygen:

## 2020-10-07 NOTE — TOC Progression Note (Signed)
Transition of Care [facility]) - Progression Note    Patient Details  Name: Ashley Adams MRN: [id number] Date of Birth: [birth year]  Transition of Care [facility]) CM/SW Contact  Geni Bers, RN Phone Number: 10/07/2020, 9:11 AM  Clinical Narrative:     Spoke with pt concerning home O2. [employer] was selected, in house rep aware, will  Deliver O2 here for pt to discharge home.   Expected Discharge Plan: Home/Self Care Barriers to Discharge: No Barriers Identified  Expected Discharge Plan and Services Expected Discharge Plan: Home/Self Care   Discharge Planning Services: CM Consult   Living arrangements for the past 2 months: Single Family Home Expected Discharge Date: 10/07/20                                     Social Determinants of Health (SDOH) Interventions    Readmission Risk Interventions No flowsheet data found.

## 2020-11-21 ENCOUNTER — Telehealth: Payer: Self-pay | Admitting: General Practice

## 2020-11-21 NOTE — Telephone Encounter (Signed)
     Went to chart to check if we received referral

## 2020-11-30 NOTE — Progress Notes (Signed)
Cardiology Office Note:    Date:  12/01/2020   ID:  Ashley Adams, DOB 09-Jan-1971, MRN [id number]  PCP:  Maryagnes Amos, FNP  Cardiologist:  No primary care provider on file.  Electrophysiologist:  None   Referring MD: Maryagnes Amos   Chief Complaint  Patient presents with  . Shortness of Breath    History of Present Illness:    Ashley Adams is a 50 y.o. female with a hx of hypertension, OSA who is referred by Caryn Bee, FNP for evaluation of dyspnea and palpitations.  She was admitted to [facility] from 11/28 through 10/07/2020 with acute hypoxic respiratory failure secondary to pneumonia from COVID-19.  She was treated with IV steroids, remdesivir, baricitinib, and was discharged on home oxygen.  She reports that since her COVID-19 infection she has had persistent shortness of breath.  Reports dyspnea with minimal exertion, including walking up stairs.  She denies any chest pain.  Reports occasional lightheadedness but denies any syncope.  Denies any lower extremity edema.  Also reports she has been having palpitations with a feeling of fluttering in her chest, typically lasting for about 10 seconds.  Often notices it when walking.  Smoked cigars for 6 months last year, no other smoking history.  No history of heart disease in her immediate family.   Past Medical History:  Diagnosis Date  . Hypertension   . Sleep apnea     No past surgical history on file.  Current Medications: Current Meds  Medication Sig  . albuterol (VENTOLIN HFA) 108 (90 Base) MCG/ACT inhaler Inhale into the lungs.  Marland Kitchen aspirin 81 MG chewable tablet Chew 81 mg by mouth daily.  Marland Kitchen Dextromethorphan-guaiFENesin (MUCINEX DM PO) Take 1 tablet by mouth daily.  Marland Kitchen ibuprofen (ADVIL) 800 MG tablet Take by mouth.  . Vitamin D, Ergocalciferol, (DRISDOL) 50000 units CAPS capsule Take 50,000 Units by mouth daily.   . [DISCONTINUED] chlorpheniramine-HYDROcodone (TUSSIONEX) 10-8 MG/5ML SUER SHAKE  LIQUID AND TAKE 5 ML BY MOUTH EVERY 12 HOURS FOR 5 DAYS AS NEEDED     Allergies:   Patient has no known allergies.   Social History   Socioeconomic History  . Marital status: Single    Spouse name: Not on file  . Number of children: Not on file  . Years of education: Not on file  . Highest education level: Not on file  Occupational History  . Not on file  Tobacco Use  . Smoking status: Never Smoker  . Smokeless tobacco: Never Used  Substance and Sexual Activity  . Alcohol use: No  . Drug use: No  . Sexual activity: Yes  Other Topics Concern  . Not on file  Social History Narrative  . Not on file   Social Determinants of Health   Financial Resource Strain: Not on file  Food Insecurity: Not on file  Transportation Needs: Not on file  Physical Activity: Not on file  Stress: Not on file  Social Connections: Not on file     Family History: The patient's family history includes Hypertension in her father and another family member.  ROS:   Please see the history of present illness.     All other systems reviewed and are negative.  EKGs/Labs/Other Studies Reviewed:    The following studies were reviewed today:   EKG:  EKG is ordered today.  The ekg ordered today demonstrates normal sinus rhythm, rate 86, ST/T abnormalities  Recent Labs: 09/29/2020: Magnesium 2.0 10/01/2020: Hemoglobin 11.2; Platelets 328 10/04/2020:  ALT 46; BUN 24; Creatinine, Ser 0.57; Potassium 3.5; Sodium 140  Recent Lipid Panel    Component Value Date/Time   TRIG 75 09/28/2020 2307    Physical Exam:    VS:  BP 120/76 (BP Location: Left Arm)   Pulse 86   Ht 5\' 7"  (1.702 m)   Wt 256 lb 9.6 oz (116.4 kg)   BMI 40.19 kg/m     Wt Readings from Last 3 Encounters:  12/01/20 256 lb 9.6 oz (116.4 kg)  09/29/20 240 lb 8.4 oz (109.1 kg)  02/01/18 259 lb (117.5 kg)     GEN: Well nourished, well developed in no acute distress HEENT: Normal NECK: No JVD; No carotid bruits LYMPHATICS: No  lymphadenopathy CARDIAC: RRR, no murmurs, rubs, gallops RESPIRATORY:  Clear to auscultation without rales, wheezing or rhonchi  ABDOMEN: Soft, non-tender, non-distended MUSCULOSKELETAL:  No edema; No deformity  SKIN: Warm and dry NEUROLOGIC:  Alert and oriented x 3 PSYCHIATRIC:  Normal affect   ASSESSMENT:    1. Shortness of breath   2. Palpitations   3. Essential hypertension    PLAN:    Dyspnea: Reports persistent dyspnea since COVID-19 intervention.  Recommend echocardiogram to rule out myocardial involvement from recent Covid infection  Palpitations: Description concerning for arrhythmia, will evaluate with Zio patch x7 days  Hypertension: Has history of hypertension but has not required antihypertensives for years.  Normotensive in clinic today  OSA: uses CPAP  RTC in 3 months  Medication Adjustments/Labs and Tests Ordered: Current medicines are reviewed at length with the patient today.  Concerns regarding medicines are outlined above.  Orders Placed This Encounter  Procedures  . LONG TERM MONITOR (3-14 DAYS)  . EKG 12-Lead  . ECHOCARDIOGRAM COMPLETE   No orders of the defined types were placed in this encounter.   Patient Instructions  Medication Instructions:  Your physician recommends that you continue on your current medications as directed. Please refer to the Current Medication list given to you today.  Testing/Procedures: Your physician has requested that you have an echocardiogram. Echocardiography is a painless test that uses sound waves to create images of your heart. It provides your doctor with information about the size and shape of your heart and how well your heart's chambers and valves are working. This procedure takes approximately one hour. There are no restrictions for this procedure.   ZIO XT- Long Term Monitor Instructions   Your physician has requested you wear your ZIO patch monitor 7 days.   This is a single patch monitor.  [employer]  supplies one patch monitor per enrollment.  Additional stickers are not available.   Please do not apply patch if you will be having a Nuclear Stress Test, Echocardiogram, Cardiac CT, MRI, or Chest Xray during the time frame you would be wearing the monitor. The patch cannot be worn during these tests.  You cannot remove and re-apply the ZIO XT patch monitor.   Your ZIO patch monitor will be sent USPS Priority mail from [facility] directly to your home address. The monitor may also be mailed to a PO BOX if home delivery is not available.   It may take 3-5 days to receive your monitor after you have been enrolled.   Once you have received you monitor, please review enclosed instructions.  Your monitor has already been registered assigning a specific monitor serial # to you.   Applying the monitor   Shave hair from upper left chest.   Hold abrader disc by  orange tab.  Rub abrader in 40 strokes over left upper chest as indicated in your monitor instructions.   Clean area with 4 enclosed alcohol pads .  Use all pads to assure are is cleaned thoroughly.  Let dry.   Apply patch as indicated in monitor instructions.  Patch will be place under collarbone on left side of chest with arrow pointing upward.   Rub patch adhesive wings for 2 minutes.Remove white label marked "1".  Remove white label marked "2".  Rub patch adhesive wings for 2 additional minutes.   While looking in a mirror, press and release button in center of patch.  A small green light will flash 3-4 times .  This will be your only indicator the monitor has been turned on.     Do not shower for the first 24 hours.  You may shower after the first 24 hours.   Press button if you feel a symptom. You will hear a small click.  Record Date, Time and Symptom in the Patient Log Book.   When you are ready to remove patch, follow instructions on last 2 pages of Patient Log Book.  Stick patch monitor onto last page of Patient Log  Book.   Place Patient Log Book in [place] box.  Use locking tab on box and tape box closed securely.  The [employer] and [employer] has [employer] on it.  Please place in mailbox as soon as possible.  Your physician should have your test results approximately 7 days after the monitor has been mailed back to [facility].   Call [facility] Customer Care at [phone] if you have questions regarding your ZIO XT patch monitor.  Call them immediately if you see an orange light blinking on your monitor.   If your monitor falls off in less than 4 days contact our Monitor department at [phone].  If your monitor becomes loose or falls off after 4 days call [employer] at [phone] for suggestions on securing your monitor.   Follow-Up: At [facility], you and your health needs are our priority.  As part of our continuing mission to provide you with exceptional heart care, we have created designated Provider Care Teams.  These Care Teams include your primary Cardiologist (physician) and Advanced Practice Providers (APPs -  Physician Assistants and Nurse Practitioners) who all work together to provide you with the care you need, when you need it.  We recommend signing up for the patient portal called "MyChart".  Sign up information is provided on this After Visit Summary.  MyChart is used to connect with patients for Virtual Visits (Telemedicine).  Patients are able to view lab/test results, encounter notes, upcoming appointments, etc.  Non-urgent messages can be sent to your provider as well.   To learn more about what you can do with MyChart, go to [email or web address].    Your next appointment:   3 month(s)  The format for your next appointment:   In Person  Provider:   Epifanio Lesches, MD        Signed, Little Ishikawa, MD  12/01/2020 5:42 PM    [facility]

## 2020-12-01 ENCOUNTER — Ambulatory Visit (INDEPENDENT_AMBULATORY_CARE_PROVIDER_SITE_OTHER): Payer: BC Managed Care – PPO | Admitting: Cardiology

## 2020-12-01 ENCOUNTER — Ambulatory Visit (INDEPENDENT_AMBULATORY_CARE_PROVIDER_SITE_OTHER): Payer: BC Managed Care – PPO

## 2020-12-01 ENCOUNTER — Encounter: Payer: Self-pay | Admitting: *Deleted

## 2020-12-01 ENCOUNTER — Other Ambulatory Visit: Payer: Self-pay

## 2020-12-01 ENCOUNTER — Encounter: Payer: Self-pay | Admitting: Cardiology

## 2020-12-01 VITALS — BP 120/76 | HR 86 | Ht 67.0 in | Wt 256.6 lb

## 2020-12-01 DIAGNOSIS — R002 Palpitations: Secondary | ICD-10-CM | POA: Diagnosis not present

## 2020-12-01 DIAGNOSIS — R0602 Shortness of breath: Secondary | ICD-10-CM | POA: Diagnosis not present

## 2020-12-01 DIAGNOSIS — I10 Essential (primary) hypertension: Secondary | ICD-10-CM

## 2020-12-01 NOTE — Progress Notes (Signed)
Patient ID: Ashley Adams, female   DOB: [birth year], 50 y.o.   MRN: [id number] Patient enrolled for [employer] to ship a 7 day ZIO XT long term holter monitor to her home.

## 2020-12-01 NOTE — Patient Instructions (Signed)
Medication Instructions:  Your physician recommends that you continue on your current medications as directed. Please refer to the Current Medication list given to you today.  Testing/Procedures: Your physician has requested that you have an echocardiogram. Echocardiography is a painless test that uses sound waves to create images of your heart. It provides your doctor with information about the size and shape of your heart and how well your heart's chambers and valves are working. This procedure takes approximately one hour. There are no restrictions for this procedure.   ZIO XT- Long Term Monitor Instructions   Your physician has requested you wear your ZIO patch monitor 7 days.   This is a single patch monitor.  [employer] supplies one patch monitor per enrollment.  Additional stickers are not available.   Please do not apply patch if you will be having a Nuclear Stress Test, Echocardiogram, Cardiac CT, MRI, or Chest Xray during the time frame you would be wearing the monitor. The patch cannot be worn during these tests.  You cannot remove and re-apply the ZIO XT patch monitor.   Your ZIO patch monitor will be sent USPS Priority mail from [facility] directly to your home address. The monitor may also be mailed to a PO BOX if home delivery is not available.   It may take 3-5 days to receive your monitor after you have been enrolled.   Once you have received you monitor, please review enclosed instructions.  Your monitor has already been registered assigning a specific monitor serial # to you.   Applying the monitor   Shave hair from upper left chest.   Hold abrader disc by orange tab.  Rub abrader in 40 strokes over left upper chest as indicated in your monitor instructions.   Clean area with 4 enclosed alcohol pads .  Use all pads to assure are is cleaned thoroughly.  Let dry.   Apply patch as indicated in monitor instructions.  Patch will be place under collarbone on left side  of chest with arrow pointing upward.   Rub patch adhesive wings for 2 minutes.Remove white label marked "1".  Remove white label marked "2".  Rub patch adhesive wings for 2 additional minutes.   While looking in a mirror, press and release button in center of patch.  A small green light will flash 3-4 times .  This will be your only indicator the monitor has been turned on.     Do not shower for the first 24 hours.  You may shower after the first 24 hours.   Press button if you feel a symptom. You will hear a small click.  Record Date, Time and Symptom in the Patient Log Book.   When you are ready to remove patch, follow instructions on last 2 pages of Patient Log Book.  Stick patch monitor onto last page of Patient Log Book.   Place Patient Log Book in [place] box.  Use locking tab on box and tape box closed securely.  The [employer] has [employer] on it.  Please place in mailbox as soon as possible.  Your physician should have your test results approximately 7 days after the monitor has been mailed back to [facility].   Call [facility] Customer Care at [phone] if you have questions regarding your ZIO XT patch monitor.  Call them immediately if you see an orange light blinking on your monitor.   If your monitor falls off in less than 4 days contact our  Monitor department at [phone].  If your monitor becomes loose or falls off after 4 days call [employer] at [phone] for suggestions on securing your monitor.   Follow-Up: At [facility], you and your health needs are our priority.  As part of our continuing mission to provide you with exceptional heart care, we have created designated Provider Care Teams.  These Care Teams include your primary Cardiologist (physician) and Advanced Practice Providers (APPs -  Physician Assistants and Nurse Practitioners) who all work together to provide you with the care you need, when you need it.  We recommend signing up for  the patient portal called "MyChart".  Sign up information is provided on this After Visit Summary.  MyChart is used to connect with patients for Virtual Visits (Telemedicine).  Patients are able to view lab/test results, encounter notes, upcoming appointments, etc.  Non-urgent messages can be sent to your provider as well.   To learn more about what you can do with MyChart, go to [email or web address].    Your next appointment:   3 month(s)  The format for your next appointment:   In Person  Provider:   Epifanio Lesches, MD

## 2020-12-25 ENCOUNTER — Encounter (HOSPITAL_COMMUNITY): Payer: Self-pay | Admitting: Cardiology

## 2020-12-25 ENCOUNTER — Ambulatory Visit (HOSPITAL_COMMUNITY): Payer: BC Managed Care – PPO | Attending: Cardiology

## 2020-12-25 NOTE — Progress Notes (Unsigned)
Patient ID: Ashley Adams, female   DOB: [birth year], 50 y.o.   MRN: [id number]  Verified appointment "no show" status with Jasmine December at 10:21am.

## 2020-12-30 ENCOUNTER — Ambulatory Visit (HOSPITAL_COMMUNITY): Payer: BC Managed Care – PPO | Attending: Cardiovascular Disease

## 2020-12-30 ENCOUNTER — Other Ambulatory Visit: Payer: Self-pay

## 2020-12-30 DIAGNOSIS — R0602 Shortness of breath: Secondary | ICD-10-CM | POA: Diagnosis not present

## 2020-12-30 LAB — ECHOCARDIOGRAM COMPLETE
Area-P 1/2: 3.91 cm2
S' Lateral: 3.4 cm

## 2021-01-15 ENCOUNTER — Other Ambulatory Visit (HOSPITAL_COMMUNITY): Payer: BC Managed Care – PPO

## 2021-02-01 NOTE — Progress Notes (Deleted)
Cardiology Office Note:    Date:  02/01/2021   ID:  Ashley Adams, DOB Oct 19, 1971, MRN [id number]  PCP:  Maryagnes Amos, FNP  Cardiologist:  No primary care provider on file.  Electrophysiologist:  None   Referring MD: Maryagnes Amos   No chief complaint on file.   History of Present Illness:    Ashley Adams is a 50 y.o. female with a hx of hypertension, OSA who presents for follow-up.  She was referred by Caryn Bee, FNP for evaluation of dyspnea and palpitations, initially seen on 12/01/2020.  She was admitted to [facility] from 11/28 through 10/07/2020 with acute hypoxic respiratory failure secondary to pneumonia from COVID-19.  She was treated with IV steroids, remdesivir, baricitinib, and was discharged on home oxygen.  She reports that since her COVID-19 infection she has had persistent shortness of breath.  Reports dyspnea with minimal exertion, including walking up stairs.  She denies any chest pain.  Reports occasional lightheadedness but denies any syncope.  Denies any lower extremity edema.  Also reports she has been having palpitations with a feeling of fluttering in her chest, typically lasting for about 10 seconds.  Often notices it when walking.  Smoked cigars for 6 months last year, no other smoking history.  No history of heart disease in her immediate family.  Echocardiogram on 12/30/2020 showed normal biventricular function, no significant valvular disease.  Zio patch x9 days on 12/18/2020 showed no significant arrhythmias, one episode of SVT lasting 15 beats.  Since last clinic visit,    Past Medical History:  Diagnosis Date  . Hypertension   . Sleep apnea     No past surgical history on file.  Current Medications: No outpatient medications have been marked as taking for the 02/06/21 encounter (Appointment) with Little Ishikawa, MD.     Allergies:   Patient has no known allergies.   Social History   Socioeconomic History  .  Marital status: Single    Spouse name: Not on file  . Number of children: Not on file  . Years of education: Not on file  . Highest education level: Not on file  Occupational History  . Not on file  Tobacco Use  . Smoking status: Never Smoker  . Smokeless tobacco: Never Used  Substance and Sexual Activity  . Alcohol use: No  . Drug use: No  . Sexual activity: Yes  Other Topics Concern  . Not on file  Social History Narrative  . Not on file   Social Determinants of Health   Financial Resource Strain: Not on file  Food Insecurity: Not on file  Transportation Needs: Not on file  Physical Activity: Not on file  Stress: Not on file  Social Connections: Not on file     Family History: The patient's family history includes Hypertension in her father and another family member.  ROS:   Please see the history of present illness.     All other systems reviewed and are negative.  EKGs/Labs/Other Studies Reviewed:    The following studies were reviewed today:   EKG:  EKG is ordered today.  The ekg ordered today demonstrates normal sinus rhythm, rate 86, ST/T abnormalities  Recent Labs: 09/29/2020: Magnesium 2.0 10/01/2020: Hemoglobin 11.2; Platelets 328 10/04/2020: ALT 46; BUN 24; Creatinine, Ser 0.57; Potassium 3.5; Sodium 140  Recent Lipid Panel    Component Value Date/Time   TRIG 75 09/28/2020 2307    Physical Exam:    VS:  There were  no vitals taken for this visit.    Wt Readings from Last 3 Encounters:  12/01/20 256 lb 9.6 oz (116.4 kg)  09/29/20 240 lb 8.4 oz (109.1 kg)  02/01/18 259 lb (117.5 kg)     GEN: Well nourished, well developed in no acute distress HEENT: Normal NECK: No JVD; No carotid bruits LYMPHATICS: No lymphadenopathy CARDIAC: RRR, no murmurs, rubs, gallops RESPIRATORY:  Clear to auscultation without rales, wheezing or rhonchi  ABDOMEN: Soft, non-tender, non-distended MUSCULOSKELETAL:  No edema; No deformity  SKIN: Warm and  dry NEUROLOGIC:  Alert and oriented x 3 PSYCHIATRIC:  Normal affect   ASSESSMENT:    No diagnosis found. PLAN:    Dyspnea: Reports persistent dyspnea since COVID-19 infection.  Echocardiogram on 12/30/2020 showed normal biventricular function, no significant valvular disease.    Palpitations: Zio patch x9 days on 12/18/2020 showed no significant arrhythmias, one episode of SVT lasting 15 beats.  Hypertension: Has history of hypertension but has not required antihypertensives for years.  Normotensive in clinic today  OSA: uses CPAP  RTC in ***  Medication Adjustments/Labs and Tests Ordered: Current medicines are reviewed at length with the patient today.  Concerns regarding medicines are outlined above.  No orders of the defined types were placed in this encounter.  No orders of the defined types were placed in this encounter.   There are no Patient Instructions on file for this visit.   Signed, Little Ishikawa, MD  02/01/2021 2:07 PM    [facility]

## 2021-02-06 ENCOUNTER — Ambulatory Visit (INDEPENDENT_AMBULATORY_CARE_PROVIDER_SITE_OTHER): Payer: BC Managed Care – PPO | Admitting: Cardiology

## 2021-02-06 ENCOUNTER — Other Ambulatory Visit: Payer: Self-pay

## 2021-02-06 ENCOUNTER — Encounter: Payer: Self-pay | Admitting: Cardiology

## 2021-02-06 VITALS — BP 108/68 | HR 66 | Ht 67.0 in | Wt 254.2 lb

## 2021-02-06 DIAGNOSIS — R002 Palpitations: Secondary | ICD-10-CM | POA: Diagnosis not present

## 2021-02-06 DIAGNOSIS — R0600 Dyspnea, unspecified: Secondary | ICD-10-CM

## 2021-02-06 DIAGNOSIS — I10 Essential (primary) hypertension: Secondary | ICD-10-CM

## 2021-02-06 DIAGNOSIS — R0609 Other forms of dyspnea: Secondary | ICD-10-CM

## 2021-02-06 MED ORDER — METOPROLOL TARTRATE 50 MG PO TABS: ORAL_TABLET | ORAL | 0 refills | Status: DC

## 2021-02-06 NOTE — Progress Notes (Signed)
Cardiology Office Note:    Date:  02/17/2021   ID:  Ashley Adams, DOB [birth year], MRN [id number]  PCP:  Ashley Amos, FNP  Cardiologist:  No primary care provider on file.  Electrophysiologist:  None   Referring MD: Ashley Adams   Chief Complaint  Patient presents with  . Shortness of Breath    History of Present Illness:    Ashley Adams is a 50 y.o. female with a hx of hypertension, OSA who presents for follow-up.  She was referred by Ashley Bee, FNP for evaluation of dyspnea and palpitations, initially seen on 12/01/2020.  She was admitted to [facility] from 11/28 through 10/07/2020 with acute hypoxic respiratory failure secondary to pneumonia from COVID-19.  She was treated with IV steroids, remdesivir, baricitinib, and was discharged on home oxygen.  She reports that since her COVID-19 infection she has had persistent shortness of breath.  Reports dyspnea with minimal exertion, including walking up stairs.  She denies any chest pain.  Reports occasional lightheadedness but denies any syncope.  Denies any lower extremity edema.  Also reports she has been having palpitations with a feeling of fluttering in her chest, typically lasting for about 10 seconds.  Often notices it when walking.  Smoked cigars for 6 months last year, no other smoking history.  No history of heart disease in her immediate family.  Echocardiogram on 12/30/2020 showed normal biventricular function, no significant valvular disease.  Zio patch x9 days on 12/18/2020 showed no significant arrhythmias, one episode of SVT lasting 15 beats.  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 She is constantly walking for this work, and still is experiencing shortness of breath with minimal exertion. She also has the same SOB as well as palpitations when climbing stairs and other exertional activities.  She denies any exertional chest pain or pressure. She states she is  normally a fast walker, but is unable to reach her baseline speed due to the shortness of breath. She has been pushing herself a bit because she wishes to return to her baseline. She denies LE edema, PND, and orthopnea. She props her legs up at night because she feels sore and achy in her legs, which she contributes to her work.  She has not been using her CPAP, and is unsure if the settings need to be reset. Her company had a trip last year and she contracted COVID.  She is unsure if she should go on this trip this year due to large crowds, and is fearful of having COVID again.      Past Medical History:  Diagnosis Date  . Hypertension   . Sleep apnea     No past surgical history on file.  Current Medications: Current Meds  Medication Sig  . albuterol (VENTOLIN HFA) 108 (90 Base) MCG/ACT inhaler Inhale into the lungs.  Marland Kitchen aspirin 81 MG chewable tablet Chew 81 mg by mouth daily.  Marland Kitchen Dextromethorphan-guaiFENesin (MUCINEX DM PO) Take 1 tablet by mouth daily.  Marland Kitchen ibuprofen (ADVIL) 800 MG tablet Take by mouth.  . metoprolol tartrate (LOPRESSOR) 50 MG tablet Take 50 mg (1 tablet) TWO hours prior to CT scan  . Vitamin D, Ergocalciferol, (DRISDOL) 50000 units CAPS capsule Take 50,000 Units by mouth daily.      Allergies:   Patient has no known allergies.   Social History   Socioeconomic History  . Marital status: Single    Spouse name: Not on file  . Number  of children: Not on file  . Years of education: Not on file  . Highest education level: Not on file  Occupational History  . Not on file  Tobacco Use  . Smoking status: Never Smoker  . Smokeless tobacco: Never Used  Substance and Sexual Activity  . Alcohol use: No  . Drug use: No  . Sexual activity: Yes  Other Topics Concern  . Not on file  Social History Narrative  . Not on file   Social Determinants of Health   Financial Resource Strain: Not on file  Food Insecurity: Not on file  Transportation Needs: Not on file   Physical Activity: Not on file  Stress: Not on file  Social Connections: Not on file     Family History: The patient's family history includes Hypertension in her father and another family member.  ROS:   Please see the history of present illness. (+)   Shortness of Breath (+)   Palpitations  All other systems reviewed and are negative.  EKGs/Labs/Other Studies Reviewed:    The following studies were reviewed today:   EKG:   02/06/2021: EKG is not ordered today.   12/01/2020: normal sinus rhythm, rate 86, ST/T abnormalities  Recent Labs: 09/29/2020: Magnesium 2.0 10/01/2020: Hemoglobin 11.2; Platelets 328 10/04/2020: ALT 46; BUN 24; Creatinine, Ser 0.57; Potassium 3.5; Sodium 140  Recent Lipid Panel    Component Value Date/Time   TRIG 75 09/28/2020 2307    Physical Exam:    VS:  BP 108/68   Pulse 66   Ht 5\' 7"  (1.702 m)   Wt 254 lb 3.2 oz (115.3 kg)   SpO2 98%   BMI 39.81 kg/m     Wt Readings from Last 3 Encounters:  02/06/21 254 lb 3.2 oz (115.3 kg)  12/01/20 256 lb 9.6 oz (116.4 kg)  09/29/20 240 lb 8.4 oz (109.1 kg)     GEN: Well nourished, well developed in no acute distress HEENT: Normal NECK: No JVD; No carotid bruits LYMPHATICS: No lymphadenopathy CARDIAC: RRR, no murmurs, rubs, gallops RESPIRATORY:  Clear to auscultation without rales, wheezing or rhonchi  ABDOMEN: Soft, non-tender, non-distended MUSCULOSKELETAL:  No edema; No deformity  SKIN: Warm and dry NEUROLOGIC:  Alert and oriented x 3 PSYCHIATRIC:  Normal affect   ASSESSMENT:    1. DOE (dyspnea on exertion)   2. Palpitations   3. Essential hypertension    PLAN:    Dyspnea: Reports persistent dyspnea since COVID-19 infection.  Echocardiogram on 12/30/2020 showed normal biventricular function, no significant valvular disease.  Continues to have dyspnea with minimal exertion.  Will plan coronary CTA for further evaluation.  Palpitations: Zio patch x9 days on 12/18/2020 showed no  significant arrhythmias, one episode of SVT lasting 15 beats.  Hypertension: Has history of hypertension but has not required antihypertensives for years.  Normotensive in clinic today  OSA: encouraged compliance with CPAP  RTC in 6 months  Medication Adjustments/Labs and Tests Ordered: Current medicines are reviewed at length with the patient today.  Concerns regarding medicines are outlined above.  Orders Placed This Encounter  Procedures  . CT CORONARY MORPH W/CTA COR W/SCORE W/CA W/CM &/OR WO/CM  . CT CORONARY FRACTIONAL FLOW RESERVE DATA PREP  . CT CORONARY FRACTIONAL FLOW RESERVE FLUID ANALYSIS   Meds ordered this encounter  Medications  . metoprolol tartrate (LOPRESSOR) 50 MG tablet    Sig: Take 50 mg (1 tablet) TWO hours prior to CT scan    Dispense:  1 tablet  Refill:  0    Patient Instructions  Medication Instructions:  Your physician recommends that you continue on your current medications as directed. Please refer to the Current Medication list given to you today.  *If you need a refill on your cardiac medications before your next appointment, please call your pharmacy*  Testing/Procedures: Coronary CTA- see instructions below  Follow-Up: At [facility], you and your health needs are our priority.  As part of our continuing mission to provide you with exceptional heart care, we have created designated Provider Care Teams.  These Care Teams include your primary Cardiologist (physician) and Advanced Practice Providers (APPs -  Physician Assistants and Nurse Practitioners) who all work together to provide you with the care you need, when you need it.  We recommend signing up for the patient portal called "MyChart".  Sign up information is provided on this After Visit Summary.  MyChart is used to connect with patients for Virtual Visits (Telemedicine).  Patients are able to view lab/test results, encounter notes, upcoming appointments, etc.  Non-urgent messages can  be sent to your provider as well.   To learn more about what you can do with MyChart, go to [email or web address].    Your next appointment:   6 month(s)  The format for your next appointment:   In Person  Provider:   Epifanio Lesches, MD     Coronary CTA instructions:  Your cardiac CT will be scheduled at one of the below locations:   [facility] [address] [place], [place] [address] [phone]  OR  [facility] [address] [place], [place] [address] [phone]  If scheduled at [facility], please arrive at the [facility] main entrance (entrance A) of [facility] 30 minutes prior to test start time. Proceed to the [facility] Radiology Department (first floor) to check-in and test prep.  If scheduled at [facility], please arrive 15 mins early for check-in and test prep.  Please follow these instructions carefully (unless otherwise directed):  On the Night Before the Test: . Be sure to Drink plenty of water. . Do not consume any caffeinated/decaffeinated beverages or chocolate 12 hours prior to your test. . Do not take any antihistamines 12 hours prior to your test.  On the Day of the Test: . Drink plenty of water until 1 hour prior to the test. . Do not eat any food 4 hours prior to the test. . You may take your regular medications prior to the test.  . Take metoprolol (Lopressor) two hours prior to test. . FEMALES- please wear underwire-free bra if available      After the Test: . Drink plenty of water. . After receiving IV contrast, you may experience a mild flushed feeling. This is normal. . On occasion, you may experience a mild rash up to 24 hours after the test. This is not dangerous. If this occurs, you can take Benadryl 25 mg and increase your fluid intake. . If you experience trouble breathing, this can be serious. If it is severe call  911 IMMEDIATELY. If it is mild, please call our office. . If you take any of these medications: Glipizide/Metformin, Avandament, Glucavance, please do not take 48 hours after completing test unless otherwise instructed.   Once we have confirmed authorization from your insurance company, we will call you to set up a date and time for your test. Based on how quickly your insurance processes prior  authorizations requests, please allow up to 4 weeks to be contacted for scheduling your Cardiac CT appointment. Be advised that routine Cardiac CT appointments could be scheduled as many as 8 weeks after your provider has ordered it.  For non-scheduling related questions, please contact the cardiac imaging nurse navigator should you have any questions/concerns: Rockwell Alexandria, Cardiac Imaging Nurse Navigator Larey Brick, Cardiac Imaging Nurse Navigator [facility] Direct Office Dial: [phone]   For scheduling needs, including cancellations and rescheduling, please call [place], [phone].       I,Mathew Stumpf,acting as a Neurosurgeon for Little Ishikawa, MD.,have documented all relevant documentation on the behalf of Little Ishikawa, MD,as directed by  Little Ishikawa, MD while in the presence of Little Ishikawa, MD.   Signed, Little Ishikawa, MD  02/17/2021 9:38 PM    [facility]

## 2021-02-06 NOTE — Patient Instructions (Addendum)
Medication Instructions:  Your physician recommends that you continue on your current medications as directed. Please refer to the Current Medication list given to you today.  *If you need a refill on your cardiac medications before your next appointment, please call your pharmacy*  Testing/Procedures: Coronary CTA- see instructions below  Follow-Up: At [facility], you and your health needs are our priority.  As part of our continuing mission to provide you with exceptional heart care, we have created designated Provider Care Teams.  These Care Teams include your primary Cardiologist (physician) and Advanced Practice Providers (APPs -  Physician Assistants and Nurse Practitioners) who all work together to provide you with the care you need, when you need it.  We recommend signing up for the patient portal called "MyChart".  Sign up information is provided on this After Visit Summary.  MyChart is used to connect with patients for Virtual Visits (Telemedicine).  Patients are able to view lab/test results, encounter notes, upcoming appointments, etc.  Non-urgent messages can be sent to your provider as well.   To learn more about what you can do with MyChart, go to [email or web address].    Your next appointment:   6 month(s)  The format for your next appointment:   In Person  Provider:   Epifanio Lesches, MD     Coronary CTA instructions:  Your cardiac CT will be scheduled at one of the below locations:   [facility] [address] [place], [place] [address] [phone]  OR  [facility] [address] [place], [place] [address] [phone]  If scheduled at [facility], please arrive at the [facility] main entrance (entrance A) of [facility] 30 minutes prior to test start time. Proceed to the [facility] Radiology Department (first floor) to check-in and test prep.  If scheduled at  [facility], please arrive 15 mins early for check-in and test prep.  Please follow these instructions carefully (unless otherwise directed):  On the Night Before the Test: . Be sure to Drink plenty of water. . Do not consume any caffeinated/decaffeinated beverages or chocolate 12 hours prior to your test. . Do not take any antihistamines 12 hours prior to your test.  On the Day of the Test: . Drink plenty of water until 1 hour prior to the test. . Do not eat any food 4 hours prior to the test. . You may take your regular medications prior to the test.  . Take metoprolol (Lopressor) two hours prior to test. . FEMALES- please wear underwire-free bra if available      After the Test: . Drink plenty of water. . After receiving IV contrast, you may experience a mild flushed feeling. This is normal. . On occasion, you may experience a mild rash up to 24 hours after the test. This is not dangerous. If this occurs, you can take Benadryl 25 mg and increase your fluid intake. . If you experience trouble breathing, this can be serious. If it is severe call 911 IMMEDIATELY. If it is mild, please call our office. . If you take any of these medications: Glipizide/Metformin, Avandament, Glucavance, please do not take 48 hours after completing test unless otherwise instructed.   Once we have confirmed authorization from your insurance company, we will call you to set up a date and time for your test. Based on how quickly your insurance processes prior authorizations requests, please allow up to 4 weeks to be  contacted for scheduling your Cardiac CT appointment. Be advised that routine Cardiac CT appointments could be scheduled as many as 8 weeks after your provider has ordered it.  For non-scheduling related questions, please contact the cardiac imaging nurse navigator should you have any questions/concerns: Rockwell Alexandria, Cardiac Imaging Nurse Navigator Larey Brick, Cardiac  Imaging Nurse Navigator [facility] Direct Office Dial: [phone]   For scheduling needs, including cancellations and rescheduling, please call [place], [phone].

## 2021-02-20 ENCOUNTER — Telehealth (HOSPITAL_COMMUNITY): Payer: Self-pay | Admitting: Emergency Medicine

## 2021-02-20 NOTE — Telephone Encounter (Signed)
error 

## 2021-02-20 NOTE — Telephone Encounter (Signed)
Reaching out to patient to offer assistance regarding upcoming cardiac imaging study; pt verbalizes understanding of appt date/time, parking situation and where to check in, pre-test NPO status and medications ordered, and verified current allergies; name and call back number provided for further questions should they arise Ashley Alexandria RN Navigator Cardiac Imaging [facility] [phone] office [phone] cell  50mg  metoprolol tartrate 

## 2021-02-24 ENCOUNTER — Ambulatory Visit (HOSPITAL_COMMUNITY)
Admission: RE | Admit: 2021-02-24 | Discharge: 2021-02-24 | Disposition: A | Discharge status: Home / Self Care | Payer: BC Managed Care – PPO | Source: Ambulatory Visit | Attending: Cardiology | Admitting: Cardiology

## 2021-02-24 ENCOUNTER — Other Ambulatory Visit: Payer: Self-pay

## 2021-02-24 ENCOUNTER — Encounter (HOSPITAL_COMMUNITY): Payer: Self-pay

## 2021-02-24 DIAGNOSIS — R0600 Dyspnea, unspecified: Secondary | ICD-10-CM

## 2021-02-24 DIAGNOSIS — R0609 Other forms of dyspnea: Secondary | ICD-10-CM

## 2021-02-24 MED ORDER — NITROGLYCERIN 0.4 MG SL SUBL
SUBLINGUAL_TABLET | SUBLINGUAL | Status: AC
  Administered 2021-02-24: 0.8 mg via SUBLINGUAL
  Filled 2021-02-24: qty 2

## 2021-02-24 MED ORDER — IOHEXOL 350 MG/ML SOLN
80.0000 mL | Freq: Once | INTRAVENOUS | Status: AC | PRN
  Administered 2021-02-24: 80 mL via INTRAVENOUS

## 2021-02-24 MED ORDER — NITROGLYCERIN 0.4 MG SL SUBL: 0.8000 mg | SUBLINGUAL_TABLET | Freq: Once | SUBLINGUAL | Status: AC

## 2021-02-25 ENCOUNTER — Telehealth: Payer: Self-pay | Admitting: Cardiology

## 2021-02-25 NOTE — Telephone Encounter (Signed)
Spoke to [facility] calling to report patient's calcium score 0.No evidence of CAD.Report to be faxed.

## 2021-02-25 NOTE — Telephone Encounter (Signed)
Call to give addendum report on CT heart study. Lafonda Mosses can be reach at [phone]

## 2021-02-25 NOTE — Telephone Encounter (Signed)
Per report: IMPRESSION: Two indeterminate right middle lobe pulmonary nodules, largest measuring 5 mm. No follow-up needed if patient is low-risk (and has no known or suspected primary neoplasm). Non-contrast chest CT can be considered in 12 months if patient is high-risk. This recommendation follows the consensus statement: Guidelines for Management of Incidental Pulmonary Nodules Detected on CT Images: From the Fleischner Society 2017; Radiology 2017; 284:228-243.  These results will be called to the ordering clinician or representative by the Radiologist Assistant, and communication documented in the PACS or [employer].   Electronically Signed   By: Danae Orleans M.D.   On: 02/25/2021 11:30    Routed to MD to make aware.

## 2021-02-27 NOTE — Telephone Encounter (Signed)
See result note.  

## 2021-03-10 ENCOUNTER — Other Ambulatory Visit: Payer: Self-pay | Admitting: *Deleted

## 2021-03-10 DIAGNOSIS — R911 Solitary pulmonary nodule: Secondary | ICD-10-CM

## 2021-09-09 NOTE — Progress Notes (Signed)
Cardiology Office Note:    Date:  09/10/2021   ID:  Ashley Adams, DOB [birth year], MRN [id number]  PCP:  Maryagnes Amos, FNP  Cardiologist:  None  Electrophysiologist:  None   Referring MD: Maryagnes Amos   No chief complaint on file.   History of Present Illness:    Ashley Adams is a 50 y.o. female with a hx of hypertension, OSA who presents for follow-up.  She was referred by Caryn Bee, FNP for evaluation of dyspnea and palpitations, initially seen on 12/01/2020.  She was admitted to [facility] from 11/28 through 10/07/2020 with acute hypoxic respiratory failure secondary to pneumonia from COVID-19.  She was treated with IV steroids, remdesivir, baricitinib, and was discharged on home oxygen.  She reports that since her COVID-19 infection she has had persistent shortness of breath.  Reports dyspnea with minimal exertion, including walking up stairs.  She denies any chest pain.  Reports occasional lightheadedness but denies any syncope.  Denies any lower extremity edema.  Also reports she has been having palpitations with a feeling of fluttering in her chest, typically lasting for about 10 seconds.  Often notices it when walking.  Smoked cigars for 6 months last year, no other smoking history.  No history of heart disease in her immediate family.  Echocardiogram on 12/30/2020 showed normal biventricular function, no significant valvular disease.  Zio patch x9 days on 12/18/2020 showed no significant arrhythmias, one episode of SVT lasting 15 beats.  Coronary CTA on 02/25/2021 showed normal coronary arteries (calcium score 0).  CT also notable for small pulmonary nodules.  Since last clinic visit, she reports that she has been doing okay.  States that dyspnea has improved.  However does report she had possible syncopal episode earlier this week.  She was sleeping without her CPAP and awoke with palpitations where she felt like her heart was racing.  She stood up and  started walking to the kitchen and next thing she knew she was on the floor.  She does not remember what happened.  She drank 2 cups of Coke before bedtime.  She reports she has only been using her CPAP about twice per week.  Denies any other episodes of palpitations or syncope.  Denies any chest pain.  Wt Readings from Last 3 Encounters:  09/10/21 262 lb 6.4 oz (119 kg)  02/06/21 254 lb 3.2 oz (115.3 kg)  12/01/20 256 lb 9.6 oz (116.4 kg)         Past Medical History:  Diagnosis Date   Hypertension    Sleep apnea     No past surgical history on file.  Current Medications: Current Meds  Medication Sig   albuterol (VENTOLIN HFA) 108 (90 Base) MCG/ACT inhaler Inhale into the lungs.   ibuprofen (ADVIL) 800 MG tablet Take by mouth.   Vitamin D, Ergocalciferol, (DRISDOL) 50000 units CAPS capsule Take 50,000 Units by mouth daily.    [DISCONTINUED] aspirin 81 MG chewable tablet Chew 81 mg by mouth daily.     Allergies:   Patient has no known allergies.   Social History   Socioeconomic History   Marital status: Single    Spouse name: Not on file   Number of children: Not on file   Years of education: Not on file   Highest education level: Not on file  Occupational History   Not on file  Tobacco Use   Smoking status: Never   Smokeless tobacco: Never  Substance and Sexual Activity  Alcohol use: No   Drug use: No   Sexual activity: Yes  Other Topics Concern   Not on file  Social History Narrative   Not on file   Social Determinants of Health   Financial Resource Strain: Not on file  Food Insecurity: Not on file  Transportation Needs: Not on file  Physical Activity: Not on file  Stress: Not on file  Social Connections: Not on file     Family History: The patient's family history includes Hypertension in her father and another family member.  ROS:   Please see the history of present illness. (+)   Shortness of Breath (+)   Palpitations  All other systems  reviewed and are negative.  EKGs/Labs/Other Studies Reviewed:    The following studies were reviewed today:   EKG:   09/10/21: Normal sinus rhythm, rate 62, no ST abnormalities 02/06/2021: EKG is not ordered today.   12/01/2020: normal sinus rhythm, rate 86, ST/T abnormalities  Recent Labs: 09/29/2020: Magnesium 2.0 10/01/2020: Hemoglobin 11.2; Platelets 328 10/04/2020: ALT 46; BUN 24; Creatinine, Ser 0.57; Potassium 3.5; Sodium 140  Recent Lipid Panel    Component Value Date/Time   TRIG 75 09/28/2020 2307    Physical Exam:    VS:  BP 128/80 (BP Location: Left Arm, Patient Position: Sitting, Cuff Size: Normal)   Pulse 62   Resp 20   Ht 5\' 7"  (1.702 m)   Wt 262 lb 6.4 oz (119 kg)   LMP 08/12/2021 (Approximate)   SpO2 100%   BMI 41.10 kg/m     Wt Readings from Last 3 Encounters:  09/10/21 262 lb 6.4 oz (119 kg)  02/06/21 254 lb 3.2 oz (115.3 kg)  12/01/20 256 lb 9.6 oz (116.4 kg)     GEN: Well nourished, well developed in no acute distress HEENT: Normal NECK: No JVD; No carotid bruits LYMPHATICS: No lymphadenopathy CARDIAC: RRR, no murmurs, rubs, gallops RESPIRATORY:  Clear to auscultation without rales, wheezing or rhonchi  ABDOMEN: Soft, non-tender, non-distended MUSCULOSKELETAL:  No edema; No deformity  SKIN: Warm and dry NEUROLOGIC:  Alert and oriented x 3 PSYCHIATRIC:  Normal affect   ASSESSMENT:    1. Syncope, unspecified syncope type   2. Palpitations   3. OSA (obstructive sleep apnea)   4. Pulmonary nodule     PLAN:    Syncope: Description of episode concerning for syncope, and given palpitations preceding possible loss of consciousness, concerning for arrhythmia as cause.  Will check Zio patch x2 weeks  Dyspnea: Reports persistent dyspnea since COVID-19 infection.  Echocardiogram on 12/30/2020 showed normal biventricular function, no significant valvular disease.  Continued to have dyspnea with minimal exertion, so coronary CTA was done on 02/25/2021  which showed normal coronary arteries (calcium score 0) -Reports dyspnea has improved.   Palpitations: Zio patch x9 days on 12/18/2020 showed no significant arrhythmias, one episode of SVT lasting 15 beats.  Given recent suspected syncopal episode, repeat Zio patch x2 weeks as above   Hypertension: Has history of hypertension but has not required antihypertensives for years.  Normotensive in clinic today   OSA: encouraged compliance with CPAP  Pulmonary nodules: Noted on coronary CTA 02/25/2021, will follow-up with noncontrast chest CT in 1 year   RTC in 6 months  Medication Adjustments/Labs and Tests Ordered: Current medicines are reviewed at length with the patient today.  Concerns regarding medicines are outlined above.  Orders Placed This Encounter  Procedures   LONG TERM MONITOR (3-14 DAYS)   EKG 12-Lead  No orders of the defined types were placed in this encounter.   Patient Instructions  Medication Instructions:  Your physician recommends that you continue on your current medications as directed. Please refer to the Current Medication list given to you today.  *If you need a refill on your cardiac medications before your next appointment, please call your pharmacy*  Testing/Procedures: ZIO XT- Long Term Monitor Instructions   Your physician has requested you wear a ZIO patch monitor for _14_ days.  This is a single patch monitor.   [employer] supplies one patch monitor per enrollment. Additional stickers are not available. Please do not apply patch if you will be having a Nuclear Stress Test, Echocardiogram, Cardiac CT, MRI, or Chest Xray during the period you would be wearing the monitor. The patch cannot be worn during these tests. You cannot remove and re-apply the ZIO XT patch monitor.  Your ZIO patch monitor will be sent Fed Ex from [employer] directly to your home address. It may take 3-5 days to receive your monitor after you have been enrolled.  Once you have  received your monitor, please review the enclosed instructions. Your monitor has already been registered assigning a specific monitor serial # to you.  Billing and Patient Assistance Program Information   We have supplied [employer] with any of your insurance information on file for billing purposes. [employer] offers a sliding scale Patient Assistance Program for patients that do not have insurance, or whose insurance does not completely cover the cost of the ZIO monitor.   You must apply for the Patient Assistance Program to qualify for this discounted rate.     To apply, please call [employer] at [phone], select option 4, then select option 2, and ask to apply for Patient Assistance Program.  Meredeth Ide will ask your household income, and how many people are in your household.  They will quote your out-of-pocket cost based on that information.  [employer] will also be able to set up a 7-month, interest-free payment plan if needed.  Applying the monitor   Shave hair from upper left chest.  Hold abrader disc by orange tab. Rub abrader in 40 strokes over the upper left chest as indicated in your monitor instructions.  Clean area with 4 enclosed alcohol pads. Let dry.  Apply patch as indicated in monitor instructions. Patch will be placed under collarbone on left side of chest with arrow pointing upward.  Rub patch adhesive wings for 2 minutes. Remove white label marked "1". Remove the white label marked "2". Rub patch adhesive wings for 2 additional minutes.  While looking in a mirror, press and release button in center of patch. A small green light will flash 3-4 times. This will be your only indicator that the monitor has been turned on. ?  Do not shower for the first 24 hours. You may shower after the first 24 hours.  Press the button if you feel a symptom. You will hear a small click. Record Date, Time and Symptom in the Patient Logbook.  When you are ready to remove the patch, follow instructions on the  last 2 pages of the Patient Logbook. Stick patch monitor onto the last page of Patient Logbook.  Place Patient Logbook in the blue and white box.  Use locking tab on box and tape box closed securely.  The blue and white box has prepaid postage on it. Please place it in the mailbox as soon as possible. Your physician should have your test results approximately  7 days after the monitor has been mailed back to [facility].  Call [facility] Customer Care at [phone] if you have questions regarding your ZIO XT patch monitor. Call them immediately if you see an orange light blinking on your monitor.  If your monitor falls off in less than 4 days, contact our Monitor department at [phone]. ?If your monitor becomes loose or falls off after 4 days call [employer] at [phone] for suggestions on securing your monitor.?  Follow-Up: At [facility], you and your health needs are our priority.  As part of our continuing mission to provide you with exceptional heart care, we have created designated Provider Care Teams.  These Care Teams include your primary Cardiologist (physician) and Advanced Practice Providers (APPs -  Physician Assistants and Nurse Practitioners) who all work together to provide you with the care you need, when you need it.  We recommend signing up for the patient portal called "MyChart".  Sign up information is provided on this After Visit Summary.  MyChart is used to connect with patients for Virtual Visits (Telemedicine).  Patients are able to view lab/test results, encounter notes, upcoming appointments, etc.  Non-urgent messages can be sent to your provider as well.   To learn more about what you can do with MyChart, go to [email or web address].    Your next appointment:   6 month(s)  The format for your next appointment:   In Person  Provider:   Dr. Reina Fuse, MD  09/10/2021 8:36 AM    [facility]

## 2021-09-10 ENCOUNTER — Ambulatory Visit (INDEPENDENT_AMBULATORY_CARE_PROVIDER_SITE_OTHER): Payer: BC Managed Care – PPO

## 2021-09-10 ENCOUNTER — Encounter: Payer: Self-pay | Admitting: Cardiology

## 2021-09-10 ENCOUNTER — Other Ambulatory Visit: Payer: Self-pay

## 2021-09-10 ENCOUNTER — Ambulatory Visit (INDEPENDENT_AMBULATORY_CARE_PROVIDER_SITE_OTHER): Payer: BC Managed Care – PPO | Admitting: Cardiology

## 2021-09-10 VITALS — BP 128/80 | HR 62 | Resp 20 | Ht 67.0 in | Wt 262.4 lb

## 2021-09-10 DIAGNOSIS — R911 Solitary pulmonary nodule: Secondary | ICD-10-CM

## 2021-09-10 DIAGNOSIS — R002 Palpitations: Secondary | ICD-10-CM

## 2021-09-10 DIAGNOSIS — R55 Syncope and collapse: Secondary | ICD-10-CM | POA: Diagnosis not present

## 2021-09-10 DIAGNOSIS — G4733 Obstructive sleep apnea (adult) (pediatric): Secondary | ICD-10-CM

## 2021-09-10 NOTE — Progress Notes (Unsigned)
Enrolled patient for a 14 day Zio XT  monitor to be mailed to patients home  °

## 2021-09-10 NOTE — Patient Instructions (Signed)
Medication Instructions:  Your physician recommends that you continue on your current medications as directed. Please refer to the Current Medication list given to you today.  *If you need a refill on your cardiac medications before your next appointment, please call your pharmacy*  Testing/Procedures: [place] Monitor Instructions   Your physician has requested you wear a ZIO patch monitor for _14_ days.  This is a single patch monitor.   [employer] supplies one patch monitor per enrollment. Additional stickers are not available. Please do not apply patch if you will be having a Nuclear Stress Test, Echocardiogram, Cardiac CT, MRI, or Chest Xray during the period you would be wearing the monitor. The patch cannot be worn during these tests. You cannot remove and re-apply the ZIO XT patch monitor.  Your ZIO patch monitor will be sent Fed Ex from [employer] directly to your home address. It may take 3-5 days to receive your monitor after you have been enrolled.  Once you have received your monitor, please review the enclosed instructions. Your monitor has already been registered assigning a specific monitor serial # to you.  Billing and Patient Assistance Program Information   We have supplied [employer] with any of your insurance information on file for billing purposes. [employer] offers a sliding scale Patient Assistance Program for patients that do not have insurance, or whose insurance does not completely cover the cost of the ZIO monitor.   You must apply for the Patient Assistance Program to qualify for this discounted rate.     To apply, please call [employer] at [phone], select option 4, then select option 2, and ask to apply for Patient Assistance Program.  Theodore Demark will ask your household income, and how many people are in your household.  They will quote your out-of-pocket cost based on that information.  [employer] will also be able to set up a 54-month interest-free payment plan  if needed.  Applying the monitor   Shave hair from upper left chest.  Hold abrader disc by orange tab. Rub abrader in 40 strokes over the upper left chest as indicated in your monitor instructions.  Clean area with 4 enclosed alcohol pads. Let dry.  Apply patch as indicated in monitor instructions. Patch will be placed under collarbone on left side of chest with arrow pointing upward.  Rub patch adhesive wings for 2 minutes. Remove white label marked "1". Remove the white label marked "2". Rub patch adhesive wings for 2 additional minutes.  While looking in a mirror, press and release button in center of patch. A small green light will flash 3-4 times. This will be your only indicator that the monitor has been turned on. ?  Do not shower for the first 24 hours. You may shower after the first 24 hours.  Press the button if you feel a symptom. You will hear a small click. Record Date, Time and Symptom in the Patient Logbook.  When you are ready to remove the patch, follow instructions on the last 2 pages of the Patient Logbook. Stick patch monitor onto the last page of Patient Logbook.  Place Patient Logbook in the blue and white box.  Use locking tab on box and tape box closed securely.  The blue and white box has prepaid postage on it. Please place it in the mailbox as soon as possible. Your physician should have your test results approximately 7 days after [facility]  Call [facility]  at [phone] if you have questions regarding your ZIO XT patch monitor. Call them immediately if you see an orange light blinking on your monitor.  If your monitor falls off in less than 4 days, contact our Monitor department at [phone]. ?If your monitor becomes loose or falls off after 4 days call [employer] at [phone] for suggestions on securing your monitor.?  Follow-Up: At [facility], you and your health needs are our priority.  As part of  our continuing mission to provide you with exceptional heart care, we have created designated Provider Care Teams.  These Care Teams include your primary Cardiologist (physician) and Advanced Practice Providers (APPs -  Physician Assistants and Nurse Practitioners) who all work together to provide you with the care you need, when you need it.  We recommend signing up for the patient portal called "MyChart".  Sign up information is provided on this After Visit Summary.  MyChart is used to connect with patients for Virtual Visits (Telemedicine).  Patients are able to view lab/test results, encounter notes, upcoming appointments, etc.  Non-urgent messages can be sent to your provider as well.   To learn more about what you can do with MyChart, go to [email or web address].    Your next appointment:   6 month(s)  The format for your next appointment:   In Person  Provider:   Dr. Bjorn Pippin

## 2021-09-12 DIAGNOSIS — R55 Syncope and collapse: Secondary | ICD-10-CM | POA: Diagnosis not present

## 2021-10-06 ENCOUNTER — Encounter: Payer: Self-pay | Admitting: Cardiology

## 2022-01-22 IMAGING — DX DG CHEST 1V PORT
1 series · 1 of 1 positions shown · non-contrast
Comparison: Chest x-ray dated 09/28/2020.

CLINICAL DATA: Acute hypoxic respiratory failure due to [id number].

EXAM:
PORTABLE CHEST 1 VIEW

[chest ap]
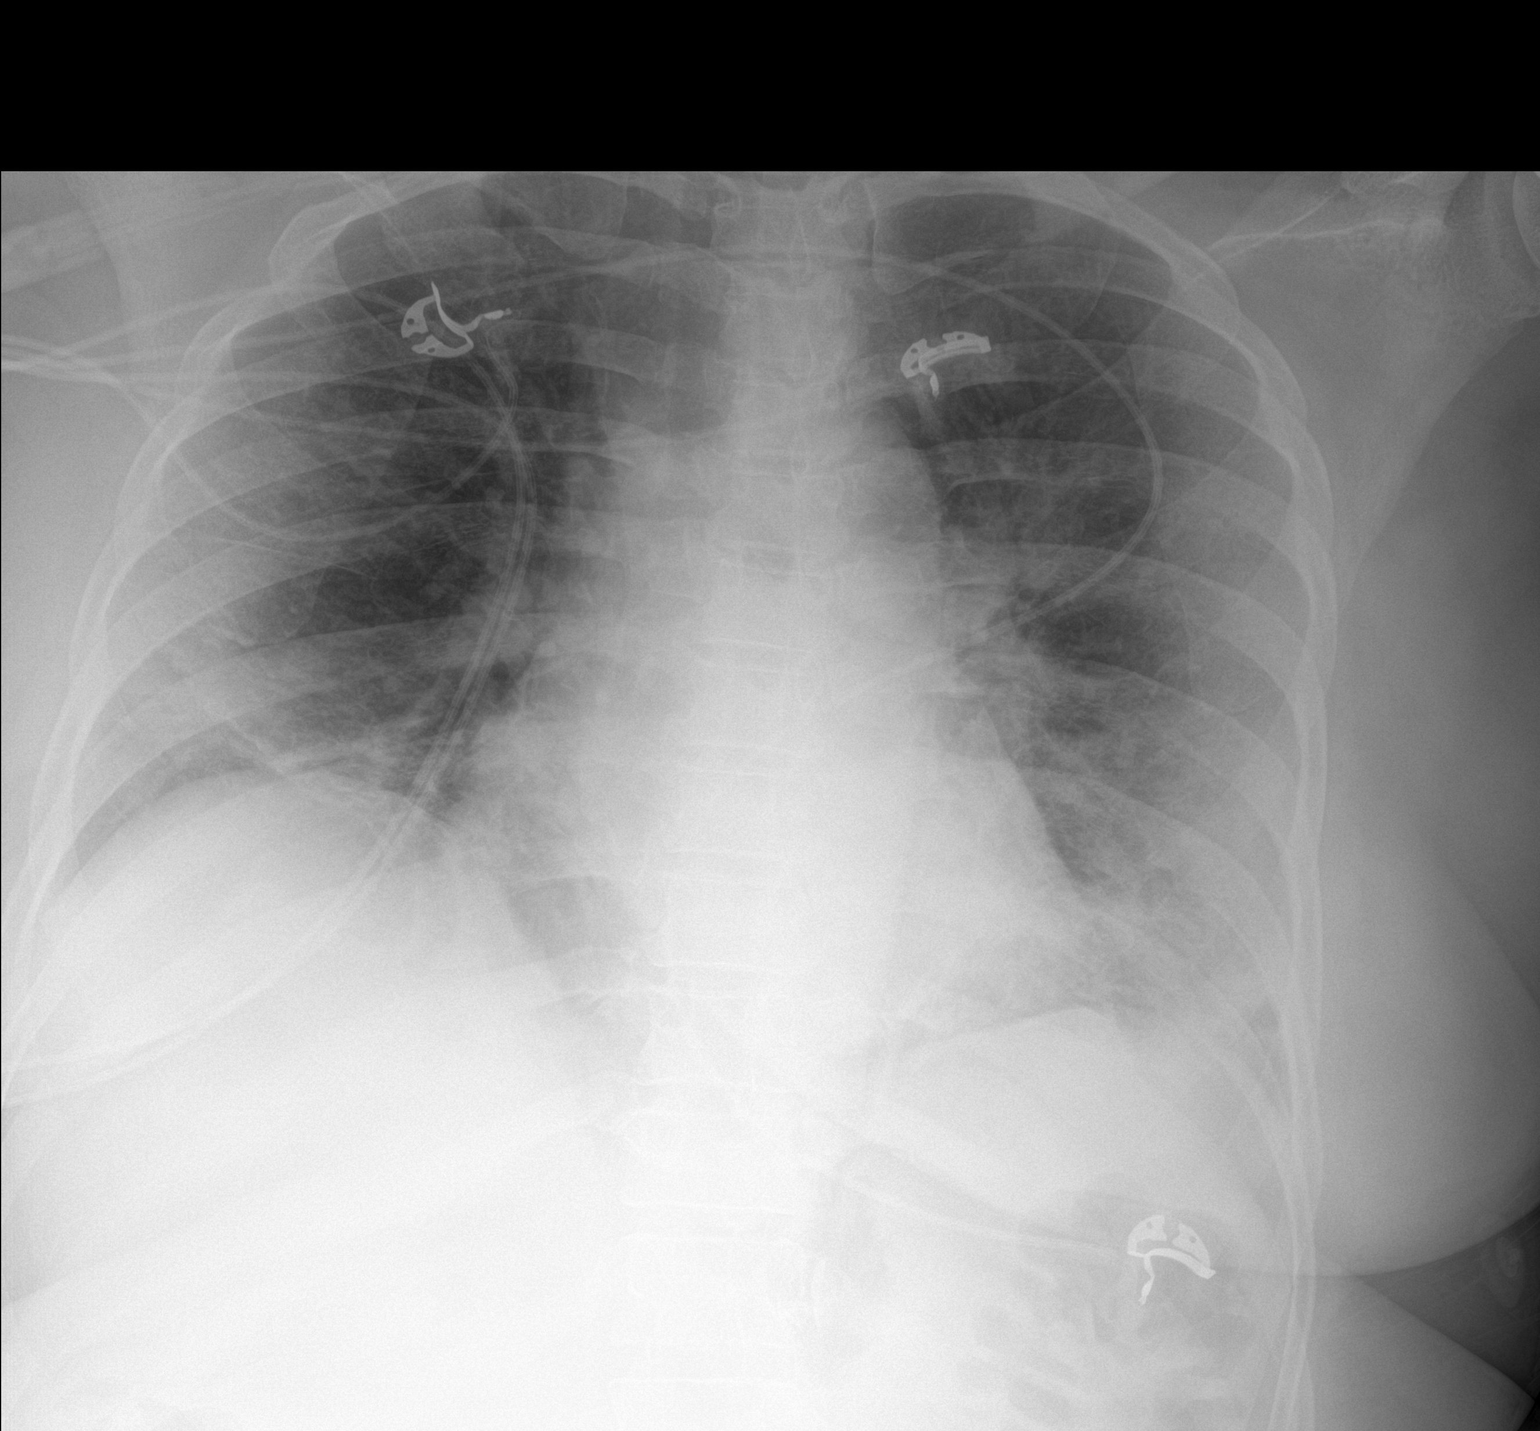

[1 of 1 positions shown; findings below may reference images not displayed]

FINDINGS: Patchy bibasilar airspace opacities, LEFT greater than RIGHT, at
least mildly worsened within the LEFT lower lung compared to the
previous chest x-ray. No pneumothorax is seen. Heart size and
mediastinal contours are stable.
IMPRESSION: Bibasilar pneumonia, worsened on the LEFT compared to chest x-ray of
09/28/2020.

## 2022-02-09 ENCOUNTER — Ambulatory Visit (HOSPITAL_COMMUNITY)
Admission: RE | Admit: 2022-02-09 | Discharge: 2022-02-09 | Disposition: A | Discharge status: Home / Self Care | Payer: BC Managed Care – PPO | Source: Ambulatory Visit | Attending: Cardiology | Admitting: Cardiology

## 2022-02-09 DIAGNOSIS — R911 Solitary pulmonary nodule: Secondary | ICD-10-CM | POA: Insufficient documentation

## 2022-02-10 ENCOUNTER — Telehealth: Payer: Self-pay | Admitting: Cardiology

## 2022-02-10 NOTE — Telephone Encounter (Signed)
Patient calling for CT results, states she sees them in MyChart but is panicking and would like to discuss with a RN ASAP. Please advise.  ?

## 2022-02-10 NOTE — Telephone Encounter (Signed)
Pt returning nurses call, pt states she can give her a call back tomorrow morning. Please advise ?

## 2022-02-10 NOTE — Telephone Encounter (Signed)
Little Ishikawa, MD  ?02/10/2022  6:42 AM EDT   ?  ?Stable lung nodules.  Does have new right upper lobe lung nodule, recommend follow-up noncontrast CT chest in 1 year  ? ? Called patient ,went straight to voicemail  - left message that the above comment was placed in result .  If she still have, question may call back. ?

## 2022-02-11 NOTE — Telephone Encounter (Signed)
Spoke to patient. Patient is concerned about the report of of CT. RN informed patient ot Dr Bjorn Pippin response to report and recommendation.  ? Rn offered appointment to try an move appointment upp to discuss or patient can also discuss with primary  to see if further  consultation is needed.  ?Patient opted to to discuss with primary and keep appointment with Dr Bjorn Pippin in May, 2023. ?

## 2022-02-22 ENCOUNTER — Other Ambulatory Visit: Payer: Self-pay | Admitting: *Deleted

## 2022-02-22 DIAGNOSIS — R911 Solitary pulmonary nodule: Secondary | ICD-10-CM

## 2022-03-29 NOTE — Progress Notes (Signed)
Cardiology Office Note:    Date:  04/12/2022   ID:  Ashley Adams, DOB 09/22/1971, MRN [id number]  PCP:  Suella Broad, FNP  Cardiologist:  None  Electrophysiologist:  None   Referring MD: Suella Broad   Chief Complaint  Patient presents with   Shortness of Breath    History of Present Illness:    Ashley Adams is a 51 y.o. female with a hx of hypertension, OSA who presents for follow-up.  She was referred by Sheran Fava, FNP for evaluation of dyspnea and palpitations, initially seen on 12/01/2020.  She was admitted to [facility] from 11/28 through 10/07/2020 with acute hypoxic respiratory failure secondary to pneumonia from COVID-19.  She was treated with IV steroids, remdesivir, baricitinib, and was discharged on home oxygen.  She reports that since her COVID-19 infection she has had persistent shortness of breath.  Reports dyspnea with minimal exertion, including walking up stairs.  She denies any chest pain.  Reports occasional lightheadedness but denies any syncope.  Denies any lower extremity edema.  Also reports she has been having palpitations with a feeling of fluttering in her chest, typically lasting for about 10 seconds.  Often notices it when walking.  Smoked cigars for less than 1 year, no other smoking history.  No history of heart disease in her immediate family.  Echocardiogram on 12/30/2020 showed normal biventricular function, no significant valvular disease.  Zio patch x9 days on 12/18/2020 showed no significant arrhythmias, one episode of SVT lasting 15 beats.  Coronary CTA on 02/25/2021 showed normal coronary arteries (calcium score 0).  CT also notable for small pulmonary nodules.  Zio patch x13 days on 10/08/2021 showed occasional PVCs (4.3% of beats).  Since last clinic visit, she reports she has been doing okay.  Started walking, about 3 times per day for total of 2 miles.  Denies any exertional chest pain or dyspnea.  She denies any  lightheadedness, syncope, or palpitations.  Does report some swelling around her ankles.  Weight up 8 pounds from prior clinic visit.  She reports compliance with CPAP.  Wt Readings from Last 3 Encounters:  03/31/22 270 lb 6.4 oz (122.7 kg)  09/10/21 262 lb 6.4 oz (119 kg)  02/06/21 254 lb 3.2 oz (115.3 kg)      Past Medical History:  Diagnosis Date   Hypertension    Sleep apnea     No past surgical history on file.  Current Medications: Current Meds  Medication Sig   albuterol (VENTOLIN HFA) 108 (90 Base) MCG/ACT inhaler Inhale into the lungs.   ibuprofen (ADVIL) 800 MG tablet Take by mouth.   Vitamin D, Ergocalciferol, (DRISDOL) 50000 units CAPS capsule Take 50,000 Units by mouth daily.      Allergies:   Patient has no known allergies.   Social History   Socioeconomic History   Marital status: Single    Spouse name: Not on file   Number of children: Not on file   Years of education: Not on file   Highest education level: Not on file  Occupational History   Not on file  Tobacco Use   Smoking status: Never   Smokeless tobacco: Never  Substance and Sexual Activity   Alcohol use: No   Drug use: No   Sexual activity: Yes  Other Topics Concern   Not on file  Social History Narrative   Not on file   Social Determinants of Health   Financial Resource Strain: Not on file  Food  Insecurity: Not on file  Transportation Needs: Not on file  Physical Activity: Not on file  Stress: Not on file  Social Connections: Not on file     Family History: The patient's family history includes Hypertension in her father and another family member.  ROS:   Please see the history of present illness.   All other systems reviewed and are negative.  EKGs/Labs/Other Studies Reviewed:    The following studies were reviewed today:   EKG:   03/31/21: Normal sinus rhythm, rate 66, no ST abnormalities 09/10/21: Normal sinus rhythm, rate 62, no ST abnormalities 12/01/2020: normal  sinus rhythm, rate 86, ST/T abnormalities  Recent Labs: No results found for requested labs within last 365 days.  Recent Lipid Panel    Component Value Date/Time   TRIG 75 09/28/2020 2307    Physical Exam:    VS:  BP 126/82   Pulse 68   Ht 5\' 7"  (1.702 m)   Wt 270 lb 6.4 oz (122.7 kg)   SpO2 99%   BMI 42.35 kg/m     Wt Readings from Last 3 Encounters:  03/31/22 270 lb 6.4 oz (122.7 kg)  09/10/21 262 lb 6.4 oz (119 kg)  02/06/21 254 lb 3.2 oz (115.3 kg)     GEN: Well nourished, well developed in no acute distress HEENT: Normal NECK: No JVD; No carotid bruits LYMPHATICS: No lymphadenopathy CARDIAC: RRR, no murmurs, rubs, gallops RESPIRATORY:  Clear to auscultation without rales, wheezing or rhonchi  ABDOMEN: Soft, non-tender, non-distended MUSCULOSKELETAL:  No edema; No deformity  SKIN: Warm and dry NEUROLOGIC:  Alert and oriented x 3 PSYCHIATRIC:  Normal affect   ASSESSMENT:    1. Syncope, unspecified syncope type   2. DOE (dyspnea on exertion)   3. Palpitations   4. Pulmonary nodule   5. Morbid obesity ([place])      PLAN:    Syncope: Zio patch x13 days on 10/08/2021 showed occasional PVCs (4.3% of beats).  Echocardiogram on 12/30/2020 showed normal biventricular function, no significant valvular disease.  No recent episodes  Dyspnea: Reports persistent dyspnea since COVID-19 infection.  Echocardiogram on 12/30/2020 showed normal biventricular function, no significant valvular disease.  Continued to have dyspnea with minimal exertion, so coronary CTA was done on 02/25/2021 which showed normal coronary arteries (calcium score 0) -Reports dyspnea has improved.   Palpitations: Zio patch x9 days on 12/18/2020 showed no significant arrhythmias, one episode of SVT lasting 15 beats.  Repeat Zio patch x13 days on 10/08/2021 was done given possible syncopal episode which showed occasional PVCs (4.3% of beats).   Hypertension: Has history of hypertension but has not required  antihypertensives for years.  Normotensive in clinic today   OSA: encouraged compliance with CPAP  Pulmonary nodules: Noted on coronary CTA 02/25/2021, repeat CT chest 01/2022 showed stable nodule but new RUL nodule, f/u recommended in 1 year  Morbid obesity: Body mass index is 42.35 kg/m.  Recently prescribed Wegovy by PCP, has not started yet.  Encouraged to start.  Encourage regular exercise, she has started walking program    RTC in 1 year  Medication Adjustments/Labs and Tests Ordered: Current medicines are reviewed at length with the patient today.  Concerns regarding medicines are outlined above.  Orders Placed This Encounter  Procedures   EKG 12-Lead    No orders of the defined types were placed in this encounter.    Patient Instructions  Medication Instructions:  Your physician recommends that you continue on your current medications as directed.  Please refer to the Current Medication list given to you today.  *If you need a refill on your cardiac medications before your next appointment, please call your pharmacy*  Follow-Up: At [facility], you and your health needs are our priority.  As part of our continuing mission to provide you with exceptional heart care, we have created designated Provider Care Teams.  These Care Teams include your primary Cardiologist (physician) and Advanced Practice Providers (APPs -  Physician Assistants and Nurse Practitioners) who all work together to provide you with the care you need, when you need it.  We recommend signing up for the patient portal called "MyChart".  Sign up information is provided on this After Visit Summary.  MyChart is used to connect with patients for Virtual Visits (Telemedicine).  Patients are able to view lab/test results, encounter notes, upcoming appointments, etc.  Non-urgent messages can be sent to your provider as well.   To learn more about what you can do with MyChart, go to [email or web address].    Your  next appointment:   12 month(s)  The format for your next appointment:   In Person  Provider:   Dr. Gardiner Rhyme  Important Information About Sugar         Donato Heinz, MD  04/12/2022 9:58 AM    [place]

## 2022-03-31 ENCOUNTER — Encounter: Payer: Self-pay | Admitting: Cardiology

## 2022-03-31 ENCOUNTER — Ambulatory Visit (INDEPENDENT_AMBULATORY_CARE_PROVIDER_SITE_OTHER): Payer: BC Managed Care – PPO | Admitting: Cardiology

## 2022-03-31 VITALS — BP 126/82 | HR 68 | Ht 67.0 in | Wt 270.4 lb

## 2022-03-31 DIAGNOSIS — R002 Palpitations: Secondary | ICD-10-CM | POA: Diagnosis not present

## 2022-03-31 DIAGNOSIS — R55 Syncope and collapse: Secondary | ICD-10-CM | POA: Diagnosis not present

## 2022-03-31 DIAGNOSIS — R0609 Other forms of dyspnea: Secondary | ICD-10-CM | POA: Diagnosis not present

## 2022-03-31 DIAGNOSIS — R911 Solitary pulmonary nodule: Secondary | ICD-10-CM | POA: Diagnosis not present

## 2022-03-31 NOTE — Patient Instructions (Signed)
Medication Instructions:  Your physician recommends that you continue on your current medications as directed. Please refer to the Current Medication list given to you today.  *If you need a refill on your cardiac medications before your next appointment, please call your pharmacy*  Follow-Up: At [facility], you and your health needs are our priority.  As part of our continuing mission to provide you with exceptional heart care, we have created designated Provider Care Teams.  These Care Teams include your primary Cardiologist (physician) and Advanced Practice Providers (APPs -  Physician Assistants and Nurse Practitioners) who all work together to provide you with the care you need, when you need it.  We recommend signing up for the patient portal called "MyChart".  Sign up information is provided on this After Visit Summary.  MyChart is used to connect with patients for Virtual Visits (Telemedicine).  Patients are able to view lab/test results, encounter notes, upcoming appointments, etc.  Non-urgent messages can be sent to your provider as well.   To learn more about what you can do with MyChart, go to [email or web address].    Your next appointment:   12 month(s)  The format for your next appointment:   In Person  Provider:   Dr. Schumann  Important Information About Sugar       

## 2022-07-06 ENCOUNTER — Other Ambulatory Visit: Payer: Self-pay | Admitting: Nurse Practitioner

## 2022-07-06 DIAGNOSIS — Z1231 Encounter for screening mammogram for malignant neoplasm of breast: Secondary | ICD-10-CM

## 2022-07-21 ENCOUNTER — Other Ambulatory Visit (HOSPITAL_BASED_OUTPATIENT_CLINIC_OR_DEPARTMENT_OTHER): Payer: Self-pay

## 2022-07-21 MED ORDER — WEGOVY 0.5 MG/0.5ML ~~LOC~~ SOAJ
SUBCUTANEOUS | 0 refills | Status: AC
  Filled 2022-07-21 – 2022-09-29 (×4): qty 2, 28d supply, fill #0

## 2022-07-22 ENCOUNTER — Other Ambulatory Visit (HOSPITAL_BASED_OUTPATIENT_CLINIC_OR_DEPARTMENT_OTHER): Payer: Self-pay

## 2022-07-29 ENCOUNTER — Other Ambulatory Visit (HOSPITAL_BASED_OUTPATIENT_CLINIC_OR_DEPARTMENT_OTHER): Payer: Self-pay

## 2022-07-30 ENCOUNTER — Other Ambulatory Visit (HOSPITAL_BASED_OUTPATIENT_CLINIC_OR_DEPARTMENT_OTHER): Payer: Self-pay

## 2022-08-02 ENCOUNTER — Other Ambulatory Visit (HOSPITAL_BASED_OUTPATIENT_CLINIC_OR_DEPARTMENT_OTHER): Payer: Self-pay

## 2022-08-03 ENCOUNTER — Other Ambulatory Visit (HOSPITAL_BASED_OUTPATIENT_CLINIC_OR_DEPARTMENT_OTHER): Payer: Self-pay

## 2022-08-05 ENCOUNTER — Other Ambulatory Visit (HOSPITAL_BASED_OUTPATIENT_CLINIC_OR_DEPARTMENT_OTHER): Payer: Self-pay

## 2022-08-06 ENCOUNTER — Other Ambulatory Visit (HOSPITAL_BASED_OUTPATIENT_CLINIC_OR_DEPARTMENT_OTHER): Payer: Self-pay

## 2022-08-08 ENCOUNTER — Other Ambulatory Visit (HOSPITAL_BASED_OUTPATIENT_CLINIC_OR_DEPARTMENT_OTHER): Payer: Self-pay

## 2022-08-11 ENCOUNTER — Other Ambulatory Visit (HOSPITAL_BASED_OUTPATIENT_CLINIC_OR_DEPARTMENT_OTHER): Payer: Self-pay

## 2022-08-13 ENCOUNTER — Other Ambulatory Visit (HOSPITAL_BASED_OUTPATIENT_CLINIC_OR_DEPARTMENT_OTHER): Payer: Self-pay

## 2022-08-14 ENCOUNTER — Other Ambulatory Visit (HOSPITAL_BASED_OUTPATIENT_CLINIC_OR_DEPARTMENT_OTHER): Payer: Self-pay

## 2022-08-16 ENCOUNTER — Other Ambulatory Visit (HOSPITAL_BASED_OUTPATIENT_CLINIC_OR_DEPARTMENT_OTHER): Payer: Self-pay

## 2022-08-17 ENCOUNTER — Other Ambulatory Visit (HOSPITAL_BASED_OUTPATIENT_CLINIC_OR_DEPARTMENT_OTHER): Payer: Self-pay

## 2022-08-19 ENCOUNTER — Other Ambulatory Visit (HOSPITAL_BASED_OUTPATIENT_CLINIC_OR_DEPARTMENT_OTHER): Payer: Self-pay

## 2022-08-20 ENCOUNTER — Other Ambulatory Visit (HOSPITAL_BASED_OUTPATIENT_CLINIC_OR_DEPARTMENT_OTHER): Payer: Self-pay

## 2022-08-23 ENCOUNTER — Other Ambulatory Visit (HOSPITAL_BASED_OUTPATIENT_CLINIC_OR_DEPARTMENT_OTHER): Payer: Self-pay

## 2022-08-23 MED ORDER — SEMAGLUTIDE-WEIGHT MANAGEMENT 1 MG/0.5ML ~~LOC~~ SOAJ
1.0000 mg | SUBCUTANEOUS | 0 refills | Status: AC
  Filled 2022-08-23: qty 2, 28d supply, fill #0

## 2022-08-27 ENCOUNTER — Other Ambulatory Visit (HOSPITAL_BASED_OUTPATIENT_CLINIC_OR_DEPARTMENT_OTHER): Payer: Self-pay

## 2022-08-31 ENCOUNTER — Other Ambulatory Visit (HOSPITAL_BASED_OUTPATIENT_CLINIC_OR_DEPARTMENT_OTHER): Payer: Self-pay

## 2022-09-02 ENCOUNTER — Other Ambulatory Visit (HOSPITAL_COMMUNITY): Payer: Self-pay

## 2022-09-03 ENCOUNTER — Other Ambulatory Visit (HOSPITAL_BASED_OUTPATIENT_CLINIC_OR_DEPARTMENT_OTHER): Payer: Self-pay

## 2022-09-06 ENCOUNTER — Other Ambulatory Visit (HOSPITAL_BASED_OUTPATIENT_CLINIC_OR_DEPARTMENT_OTHER): Payer: Self-pay

## 2022-09-06 MED ORDER — WEGOVY 1.7 MG/0.75ML ~~LOC~~ SOAJ
SUBCUTANEOUS | 0 refills | Status: AC
  Filled 2022-09-29: qty 3, fill #0

## 2022-09-06 MED ORDER — WEGOVY 1.7 MG/0.75ML ~~LOC~~ SOAJ: SUBCUTANEOUS | 0 refills | Status: DC

## 2022-09-29 ENCOUNTER — Other Ambulatory Visit (HOSPITAL_BASED_OUTPATIENT_CLINIC_OR_DEPARTMENT_OTHER): Payer: Self-pay

## 2022-10-04 ENCOUNTER — Other Ambulatory Visit (HOSPITAL_BASED_OUTPATIENT_CLINIC_OR_DEPARTMENT_OTHER): Payer: Self-pay

## 2022-11-14 ENCOUNTER — Other Ambulatory Visit (HOSPITAL_BASED_OUTPATIENT_CLINIC_OR_DEPARTMENT_OTHER): Payer: Self-pay

## 2022-11-17 ENCOUNTER — Other Ambulatory Visit: Payer: Self-pay

## 2022-11-24 ENCOUNTER — Other Ambulatory Visit (HOSPITAL_BASED_OUTPATIENT_CLINIC_OR_DEPARTMENT_OTHER): Payer: Self-pay

## 2022-11-29 ENCOUNTER — Other Ambulatory Visit (HOSPITAL_BASED_OUTPATIENT_CLINIC_OR_DEPARTMENT_OTHER): Payer: Self-pay

## 2023-02-06 ENCOUNTER — Other Ambulatory Visit (HOSPITAL_BASED_OUTPATIENT_CLINIC_OR_DEPARTMENT_OTHER): Payer: Self-pay

## 2023-02-23 ENCOUNTER — Ambulatory Visit (HOSPITAL_COMMUNITY)
Admission: RE | Admit: 2023-02-23 | Discharge: 2023-02-23 | Disposition: A | Discharge status: Home / Self Care | Payer: BC Managed Care – PPO | Source: Ambulatory Visit | Attending: Cardiology | Admitting: Cardiology

## 2023-02-23 DIAGNOSIS — R911 Solitary pulmonary nodule: Secondary | ICD-10-CM | POA: Insufficient documentation

## 2023-03-09 ENCOUNTER — Telehealth: Payer: Self-pay | Admitting: Cardiology

## 2023-03-09 NOTE — Telephone Encounter (Signed)
Returned call to patient. She said someone called her on Monday about her CT test that was abnormal. There is no note in the chart who called her, but results were released and she reviewed and CT was WNL/improved. Reassured her of this. Scheduled her of 1 year visit on 04/04/23.   She also inquired if Rx(s) for massages, waxing could be done so her FSA would cover this. Advised I was unsure, but to discuss with PCP

## 2023-03-09 NOTE — Telephone Encounter (Signed)
Patient states she got a call on 5/6 bout her CT. Please advise

## 2023-04-04 ENCOUNTER — Encounter: Payer: Self-pay | Admitting: Cardiology

## 2023-04-04 ENCOUNTER — Ambulatory Visit: Payer: BC Managed Care – PPO | Attending: Cardiology | Admitting: Cardiology

## 2023-04-04 VITALS — BP 126/80 | HR 80 | Ht 67.0 in | Wt 247.0 lb

## 2023-04-04 DIAGNOSIS — R002 Palpitations: Secondary | ICD-10-CM | POA: Diagnosis not present

## 2023-04-04 DIAGNOSIS — R911 Solitary pulmonary nodule: Secondary | ICD-10-CM

## 2023-04-04 DIAGNOSIS — Z13220 Encounter for screening for lipoid disorders: Secondary | ICD-10-CM

## 2023-04-04 DIAGNOSIS — G4733 Obstructive sleep apnea (adult) (pediatric): Secondary | ICD-10-CM | POA: Diagnosis not present

## 2023-04-04 LAB — CBC

## 2023-04-04 NOTE — Progress Notes (Signed)
Cardiology Office Note:    Date:  04/04/2023   ID:  Ashley Adams, DOB 01/13/1971, MRN [id number]  PCP:  Maryagnes Amos, FNP  Cardiologist:  None  Electrophysiologist:  None   Referring MD: Maryagnes Amos   Chief Complaint  Patient presents with   Loss of Consciousness    History of Present Illness:    Ashley Adams is a 52 y.o. female with a hx of hypertension, OSA who presents for follow-up.  She was referred by Caryn Bee, FNP for evaluation of dyspnea and palpitations, initially seen on 12/01/2020.  She was admitted to [facility] from 11/28 through 10/07/2020 with acute hypoxic respiratory failure secondary to pneumonia from COVID-19.  She was treated with IV steroids, remdesivir, baricitinib, and was discharged on home oxygen.  She reports that since her COVID-19 infection she has had persistent shortness of breath.  Reports dyspnea with minimal exertion, including walking up stairs.  She denies any chest pain.  Reports occasional lightheadedness but denies any syncope.  Denies any lower extremity edema.  Also reports she has been having palpitations with a feeling of fluttering in her chest, typically lasting for about 10 seconds.  Often notices it when walking.  Smoked cigars for less than 1 year, no other smoking history.  No history of heart disease in her immediate family.  Echocardiogram on 12/30/2020 showed normal biventricular function, no significant valvular disease.  Zio patch x9 days on 12/18/2020 showed no significant arrhythmias, one episode of SVT lasting 15 beats.  Coronary CTA on 02/25/2021 showed normal coronary arteries (calcium score 0).  CT also notable for small pulmonary nodules.  Zio patch x13 days on 10/08/2021 showed occasional PVCs (4.3% of beats).  Since last clinic visit, she reports she is doing well.  Denies any recent lightheadedness or syncope.  Reports palpitations have resolved.  Denies any chest pain, dyspnea, or lower extremity edema.   She was started on Wegovy, has lost 23 pounds in the last year.  She walks 3 times per day for 15 to 20 minutes.  Reports has not been using CPAP.   Wt Readings from Last 3 Encounters:  04/04/23 247 lb (112 kg)  03/31/22 270 lb 6.4 oz (122.7 kg)  09/10/21 262 lb 6.4 oz (119 kg)      Past Medical History:  Diagnosis Date   Hypertension    Sleep apnea     No past surgical history on file.  Current Medications: Current Meds  Medication Sig   albuterol (VENTOLIN HFA) 108 (90 Base) MCG/ACT inhaler Inhale into the lungs.   ibuprofen (ADVIL) 800 MG tablet Take by mouth.   Semaglutide-Weight Management (WEGOVY) 0.5 MG/0.5ML SOAJ Inject 0.5 mL every week by subcutaneous route as directed for 30 days.   Semaglutide-Weight Management (WEGOVY) 1.7 MG/0.75ML SOAJ Inject 1.7 milligrams under the skin once weekly as directed.   Semaglutide-Weight Management 1 MG/0.5ML SOAJ Inject 1 mg into the skin once a week.   Vitamin D, Ergocalciferol, (DRISDOL) 50000 units CAPS capsule Take 50,000 Units by mouth daily.      Allergies:   Patient has no known allergies.   Social History   Socioeconomic History   Marital status: Single    Spouse name: Not on file   Number of children: Not on file   Years of education: Not on file   Highest education level: Not on file  Occupational History   Not on file  Tobacco Use   Smoking status: Never   Smokeless  tobacco: Never  Substance and Sexual Activity   Alcohol use: No   Drug use: No   Sexual activity: Yes  Other Topics Concern   Not on file  Social History Narrative   Not on file   Social Determinants of Health   Financial Resource Strain: Not on file  Food Insecurity: Not on file  Transportation Needs: Not on file  Physical Activity: Not on file  Stress: Not on file  Social Connections: Not on file     Family History: The patient's family history includes Hypertension in her father and another family member.  ROS:   Please see the  history of present illness.   All other systems reviewed and are negative.  EKGs/Labs/Other Studies Reviewed:    The following studies were reviewed today:   EKG:   04/04/2023: Normal sinus rhythm, rate 88, no ST abnormality 03/31/21: Normal sinus rhythm, rate 66, no ST abnormalities 09/10/21: Normal sinus rhythm, rate 62, no ST abnormalities 12/01/2020: normal sinus rhythm, rate 86, ST/T abnormalities  Recent Labs: No results found for requested labs within last 365 days.  Recent Lipid Panel    Component Value Date/Time   TRIG 75 09/28/2020 2307    Physical Exam:    VS:  BP 126/80   Pulse 80   Ht 5\' 7"  (1.702 m)   Wt 247 lb (112 kg)   SpO2 97%   BMI 38.69 kg/m     Wt Readings from Last 3 Encounters:  04/04/23 247 lb (112 kg)  03/31/22 270 lb 6.4 oz (122.7 kg)  09/10/21 262 lb 6.4 oz (119 kg)     GEN: Well nourished, well developed in no acute distress HEENT: Normal NECK: No JVD; No carotid bruits LYMPHATICS: No lymphadenopathy CARDIAC: RRR, no murmurs, rubs, gallops RESPIRATORY:  Clear to auscultation without rales, wheezing or rhonchi  ABDOMEN: Soft, non-tender, non-distended MUSCULOSKELETAL:  No edema; No deformity  SKIN: Warm and dry NEUROLOGIC:  Alert and oriented x 3 PSYCHIATRIC:  Normal affect   ASSESSMENT:    1. Palpitations   2. Morbid obesity ([facility])   3. Pulmonary nodule   4. OSA (obstructive sleep apnea)   5. Lipid screening       PLAN:    Syncope: Zio patch x13 days on 10/08/2021 showed occasional PVCs (4.3% of beats).  Echocardiogram on 12/30/2020 showed normal biventricular function, no significant valvular disease.  No recent episodes  Dyspnea: Reports persistent dyspnea since COVID-19 infection.  Echocardiogram on 12/30/2020 showed normal biventricular function, no significant valvular disease.  Continued to have dyspnea with minimal exertion, so coronary CTA was done on 02/25/2021 which showed normal coronary arteries (calcium score 0) -Reports  dyspnea has improved.   Palpitations: Zio patch x9 days on 12/18/2020 showed no significant arrhythmias, one episode of SVT lasting 15 beats.  Repeat Zio patch x13 days on 10/08/2021 was done given possible syncopal episode which showed occasional PVCs (4.3% of beats). -Reports palpitations have resolved   Hypertension: Has history of hypertension but has not required antihypertensives for years.  Normotensive in clinic today   OSA: Reports has not been using CPAP, encouraged compliance with CPAP  Pulmonary nodules: Noted on coronary CTA 02/25/2021, repeat CT chest 01/2022 showed stable nodule but new RUL nodule, f/u recommended in 1 year.  CT chest/2024 showed stable nodules, no follow-up recommended  Morbid obesity: Body mass index is 38.69 kg/m.  On Wegovy, has lost 23 lbs over last year  Lipid screening: Check lipid panel  Diabetes screening: Check  A1c   RTC in 1 year  Medication Adjustments/Labs and Tests Ordered: Current medicines are reviewed at length with the patient today.  Concerns regarding medicines are outlined above.  Orders Placed This Encounter  Procedures   Lipid panel   Basic metabolic panel   Hemoglobin A1c   CBC   EKG 12-Lead    No orders of the defined types were placed in this encounter.    Patient Instructions  Medication Instructions:  The current medical regimen is effective;  continue present plan and medications.  *If you need a refill on your cardiac medications before your next appointment, please call your pharmacy*   Lab Work: LIPID, A1C, BMET, CBC today   If you have labs (blood work) drawn today and your tests are completely normal, you will receive your results only by: MyChart Message (if you have MyChart) OR A paper copy in the mail If you have any lab test that is abnormal or we need to change your treatment, we will call you to review the results.   Follow-Up: At [facility], you and your health needs are our priority.   As part of our continuing mission to provide you with exceptional heart care, we have created designated Provider Care Teams.  These Care Teams include your primary Cardiologist (physician) and Advanced Practice Providers (APPs -  Physician Assistants and Nurse Practitioners) who all work together to provide you with the care you need, when you need it.  We recommend signing up for the patient portal called "MyChart".  Sign up information is provided on this After Visit Summary.  MyChart is used to connect with patients for Virtual Visits (Telemedicine).  Patients are able to view lab/test results, encounter notes, upcoming appointments, etc.  Non-urgent messages can be sent to your provider as well.   To learn more about what you can do with MyChart, go to [email or web address].    Your next appointment:   12 month(s)  Provider:   Epifanio Lesches, MD      Little Ishikawa, MD  04/04/2023 9:10 AM    [facility]

## 2023-04-04 NOTE — Patient Instructions (Signed)
Medication Instructions:  The current medical regimen is effective;  continue present plan and medications.  *If you need a refill on your cardiac medications before your next appointment, please call your pharmacy*   Lab Work: LIPID, A1C, BMET, CBC today   If you have labs (blood work) drawn today and your tests are completely normal, you will receive your results only by: MyChart Message (if you have MyChart) OR A paper copy in the mail If you have any lab test that is abnormal or we need to change your treatment, we will call you to review the results.   Follow-Up: At [facility], you and your health needs are our priority.  As part of our continuing mission to provide you with exceptional heart care, we have created designated Provider Care Teams.  These Care Teams include your primary Cardiologist (physician) and Advanced Practice Providers (APPs -  Physician Assistants and Nurse Practitioners) who all work together to provide you with the care you need, when you need it.  We recommend signing up for the patient portal called "MyChart".  Sign up information is provided on this After Visit Summary.  MyChart is used to connect with patients for Virtual Visits (Telemedicine).  Patients are able to view lab/test results, encounter notes, upcoming appointments, etc.  Non-urgent messages can be sent to your provider as well.   To learn more about what you can do with MyChart, go to [email or web address].    Your next appointment:   12 month(s)  Provider:   Epifanio Lesches, MD

## 2023-04-05 LAB — BASIC METABOLIC PANEL
BUN/Creatinine Ratio: 10 (ref 9–23)
BUN: 7 mg/dL (ref 6–24)
CO2: 23 mmol/L (ref 20–29)
Calcium: 9.3 mg/dL (ref 8.7–10.2)
Chloride: 106 mmol/L (ref 96–106)
Creatinine, Ser: 0.72 mg/dL (ref 0.57–1.00)
Glucose: 81 mg/dL (ref 70–99)
Potassium: 4.3 mmol/L (ref 3.5–5.2)
Sodium: 139 mmol/L (ref 134–144)
eGFR: 101 mL/min/{1.73_m2} (ref >59)

## 2023-04-05 LAB — LIPID PANEL
Chol/HDL Ratio: 3.4 ratio (ref 0.0–4.4)
Cholesterol, Total: 140 mg/dL (ref 100–199)
HDL: 41 mg/dL (ref >39)
LDL Chol Calc (NIH): 79 mg/dL (ref 0–99)
Triglycerides: 110 mg/dL (ref 0–149)
VLDL Cholesterol Cal: 20 mg/dL (ref 5–40)

## 2023-04-05 LAB — HEMOGLOBIN A1C
Est. average glucose Bld gHb Est-mCnc: 117 mg/dL
Hgb A1c MFr Bld: 5.7 % — ABNORMAL HIGH (ref 4.8–5.6)

## 2023-04-05 LAB — CBC
Hematocrit: 35.6 % (ref 34.0–46.6)
Hemoglobin: 11.2 g/dL (ref 11.1–15.9)
MCH: 27 pg (ref 26.6–33.0)
MCHC: 31.5 g/dL (ref 31.5–35.7)
MCV: 86 fL (ref 79–97)
Platelets: 315 10*3/uL (ref 150–450)
RBC: 4.15 x10E6/uL (ref 3.77–5.28)
RDW: 18 % — ABNORMAL HIGH (ref 11.7–15.4)
WBC: 6 10*3/uL (ref 3.4–10.8)

## 2023-07-05 ENCOUNTER — Inpatient Hospital Stay: Admission: RE | Admit: 2023-07-05 | Payer: BC Managed Care – PPO | Source: Ambulatory Visit

## 2025-02-15 ENCOUNTER — Ambulatory Visit: Admitting: Physician Assistant
